# Patient Record
Sex: Female | Born: 1945
Health system: Southern US, Community
[De-identification: ages and names within clinical notes are randomized; demographics above are authoritative.]

## PROBLEM LIST (undated history)

## (undated) DIAGNOSIS — I10 Essential (primary) hypertension: Secondary | ICD-10-CM

## (undated) DIAGNOSIS — I2510 Atherosclerotic heart disease of native coronary artery without angina pectoris: Secondary | ICD-10-CM

## (undated) DIAGNOSIS — I429 Cardiomyopathy, unspecified: Secondary | ICD-10-CM

## (undated) DIAGNOSIS — K922 Gastrointestinal hemorrhage, unspecified: Secondary | ICD-10-CM

## (undated) DIAGNOSIS — E782 Mixed hyperlipidemia: Secondary | ICD-10-CM

## (undated) DIAGNOSIS — E119 Type 2 diabetes mellitus without complications: Secondary | ICD-10-CM

## (undated) DIAGNOSIS — N183 Chronic kidney disease, stage 3 unspecified: Secondary | ICD-10-CM

## (undated) HISTORY — DX: Chronic kidney disease, stage 3 unspecified: N18.30

## (undated) HISTORY — DX: Gastrointestinal hemorrhage, unspecified: K92.2

## (undated) HISTORY — PX: OTHER SURGICAL HISTORY: SHX169

## (undated) HISTORY — DX: Atherosclerotic heart disease of native coronary artery without angina pectoris: I25.10

## (undated) HISTORY — DX: Essential (primary) hypertension: I10

## (undated) HISTORY — DX: Type 2 diabetes mellitus without complications: E11.9

## (undated) HISTORY — DX: Cardiomyopathy, unspecified: I42.9

## (undated) HISTORY — DX: Mixed hyperlipidemia: E78.2

---

## 2020-08-03 ENCOUNTER — Other Ambulatory Visit: Payer: Self-pay

## 2020-08-03 ENCOUNTER — Encounter (HOSPITAL_COMMUNITY): Payer: Self-pay | Admitting: Emergency Medicine

## 2020-08-03 ENCOUNTER — Inpatient Hospital Stay (HOSPITAL_COMMUNITY)
Admission: EM | Admit: 2020-08-03 | Discharge: 2020-08-09 | DRG: 246 | Disposition: A | Discharge status: Home / Self Care | Payer: Medicare Other | Attending: Family Medicine | Admitting: Family Medicine

## 2020-08-03 ENCOUNTER — Emergency Department (HOSPITAL_COMMUNITY): Payer: Medicare Other

## 2020-08-03 DIAGNOSIS — I5041 Acute combined systolic (congestive) and diastolic (congestive) heart failure: Secondary | ICD-10-CM | POA: Diagnosis not present

## 2020-08-03 DIAGNOSIS — R42 Dizziness and giddiness: Secondary | ICD-10-CM | POA: Diagnosis not present

## 2020-08-03 DIAGNOSIS — N179 Acute kidney failure, unspecified: Secondary | ICD-10-CM | POA: Diagnosis not present

## 2020-08-03 DIAGNOSIS — E119 Type 2 diabetes mellitus without complications: Secondary | ICD-10-CM

## 2020-08-03 DIAGNOSIS — E1122 Type 2 diabetes mellitus with diabetic chronic kidney disease: Secondary | ICD-10-CM | POA: Diagnosis present

## 2020-08-03 DIAGNOSIS — I214 Non-ST elevation (NSTEMI) myocardial infarction: Secondary | ICD-10-CM | POA: Diagnosis present

## 2020-08-03 DIAGNOSIS — I161 Hypertensive emergency: Secondary | ICD-10-CM | POA: Diagnosis present

## 2020-08-03 DIAGNOSIS — E876 Hypokalemia: Secondary | ICD-10-CM | POA: Diagnosis present

## 2020-08-03 DIAGNOSIS — I5021 Acute systolic (congestive) heart failure: Secondary | ICD-10-CM | POA: Diagnosis present

## 2020-08-03 DIAGNOSIS — I509 Heart failure, unspecified: Secondary | ICD-10-CM

## 2020-08-03 DIAGNOSIS — I2510 Atherosclerotic heart disease of native coronary artery without angina pectoris: Secondary | ICD-10-CM | POA: Diagnosis not present

## 2020-08-03 DIAGNOSIS — Z833 Family history of diabetes mellitus: Secondary | ICD-10-CM

## 2020-08-03 DIAGNOSIS — I255 Ischemic cardiomyopathy: Secondary | ICD-10-CM | POA: Diagnosis present

## 2020-08-03 DIAGNOSIS — I130 Hypertensive heart and chronic kidney disease with heart failure and stage 1 through stage 4 chronic kidney disease, or unspecified chronic kidney disease: Secondary | ICD-10-CM | POA: Diagnosis present

## 2020-08-03 DIAGNOSIS — J9601 Acute respiratory failure with hypoxia: Secondary | ICD-10-CM | POA: Diagnosis present

## 2020-08-03 DIAGNOSIS — R112 Nausea with vomiting, unspecified: Secondary | ICD-10-CM | POA: Diagnosis not present

## 2020-08-03 DIAGNOSIS — Z8249 Family history of ischemic heart disease and other diseases of the circulatory system: Secondary | ICD-10-CM | POA: Diagnosis not present

## 2020-08-03 DIAGNOSIS — I25110 Atherosclerotic heart disease of native coronary artery with unstable angina pectoris: Secondary | ICD-10-CM | POA: Diagnosis present

## 2020-08-03 DIAGNOSIS — N182 Chronic kidney disease, stage 2 (mild): Secondary | ICD-10-CM | POA: Diagnosis present

## 2020-08-03 DIAGNOSIS — Z955 Presence of coronary angioplasty implant and graft: Secondary | ICD-10-CM

## 2020-08-03 DIAGNOSIS — I5020 Unspecified systolic (congestive) heart failure: Secondary | ICD-10-CM | POA: Diagnosis not present

## 2020-08-03 DIAGNOSIS — E1169 Type 2 diabetes mellitus with other specified complication: Secondary | ICD-10-CM | POA: Diagnosis not present

## 2020-08-03 DIAGNOSIS — R001 Bradycardia, unspecified: Secondary | ICD-10-CM | POA: Diagnosis not present

## 2020-08-03 DIAGNOSIS — E785 Hyperlipidemia, unspecified: Secondary | ICD-10-CM | POA: Diagnosis present

## 2020-08-03 DIAGNOSIS — I429 Cardiomyopathy, unspecified: Secondary | ICD-10-CM | POA: Diagnosis not present

## 2020-08-03 DIAGNOSIS — Z20822 Contact with and (suspected) exposure to covid-19: Secondary | ICD-10-CM | POA: Diagnosis present

## 2020-08-03 DIAGNOSIS — J810 Acute pulmonary edema: Secondary | ICD-10-CM | POA: Diagnosis not present

## 2020-08-03 HISTORY — DX: Essential (primary) hypertension: I10

## 2020-08-03 LAB — CBC WITH DIFFERENTIAL/PLATELET
Abs Immature Granulocytes: 0.04 10*3/uL (ref 0.00–0.07)
Basophils Absolute: 0.1 10*3/uL (ref 0.0–0.1)
Basophils Relative: 1 %
Eosinophils Absolute: 0.3 10*3/uL (ref 0.0–0.5)
Eosinophils Relative: 2 %
HCT: 48.1 % — ABNORMAL HIGH (ref 36.0–46.0)
Hemoglobin: 15.3 g/dL — ABNORMAL HIGH (ref 12.0–15.0)
Immature Granulocytes: 0 %
Lymphocytes Relative: 44 %
Lymphs Abs: 6.1 10*3/uL — ABNORMAL HIGH (ref 0.7–4.0)
MCH: 27.6 pg (ref 26.0–34.0)
MCHC: 31.8 g/dL (ref 30.0–36.0)
MCV: 86.7 fL (ref 80.0–100.0)
Monocytes Absolute: 0.7 10*3/uL (ref 0.1–1.0)
Monocytes Relative: 5 %
Neutro Abs: 6.8 10*3/uL (ref 1.7–7.7)
Neutrophils Relative %: 48 %
Platelets: 247 10*3/uL (ref 150–400)
RBC: 5.55 MIL/uL — ABNORMAL HIGH (ref 3.87–5.11)
RDW: 14 % (ref 11.5–15.5)
WBC: 13.9 10*3/uL — ABNORMAL HIGH (ref 4.0–10.5)
nRBC: 0 % (ref 0.0–0.2)

## 2020-08-03 LAB — COMPREHENSIVE METABOLIC PANEL
ALT: 16 U/L (ref 0–44)
AST: 28 U/L (ref 15–41)
Albumin: 4.3 g/dL (ref 3.5–5.0)
Alkaline Phosphatase: 86 U/L (ref 38–126)
Anion gap: 16 — ABNORMAL HIGH (ref 5–15)
BUN: 14 mg/dL (ref 8–23)
CO2: 20 mmol/L — ABNORMAL LOW (ref 22–32)
Calcium: 8.9 mg/dL (ref 8.9–10.3)
Chloride: 103 mmol/L (ref 98–111)
Creatinine, Ser: 1.16 mg/dL — ABNORMAL HIGH (ref 0.44–1.00)
GFR, Estimated: 50 mL/min — ABNORMAL LOW (ref >60)
Glucose, Bld: 249 mg/dL — ABNORMAL HIGH (ref 70–99)
Potassium: 2.9 mmol/L — ABNORMAL LOW (ref 3.5–5.1)
Sodium: 139 mmol/L (ref 135–145)
Total Bilirubin: 0.3 mg/dL (ref 0.3–1.2)
Total Protein: 8.4 g/dL — ABNORMAL HIGH (ref 6.5–8.1)

## 2020-08-03 LAB — TROPONIN I (HIGH SENSITIVITY)
Troponin I (High Sensitivity): 15 ng/L (ref <18)
Troponin I (High Sensitivity): 64 ng/L — ABNORMAL HIGH (ref <18)

## 2020-08-03 LAB — RESP PANEL BY RT-PCR (FLU A&B, COVID) ARPGX2
Influenza A by PCR: NEGATIVE
Influenza B by PCR: NEGATIVE
SARS Coronavirus 2 by RT PCR: NEGATIVE

## 2020-08-03 LAB — BRAIN NATRIURETIC PEPTIDE: B Natriuretic Peptide: 278 pg/mL — ABNORMAL HIGH (ref 0.0–100.0)

## 2020-08-03 MED ORDER — ONDANSETRON HCL 4 MG/2ML IJ SOLN
4.0000 mg | Freq: Once | INTRAMUSCULAR | Status: AC
  Administered 2020-08-03: 4 mg via INTRAVENOUS
  Filled 2020-08-03: qty 2

## 2020-08-03 MED ORDER — POTASSIUM CHLORIDE CRYS ER 20 MEQ PO TBCR
40.0000 meq | EXTENDED_RELEASE_TABLET | Freq: Once | ORAL | Status: AC
  Administered 2020-08-03: 40 meq via ORAL
  Filled 2020-08-03: qty 2

## 2020-08-03 MED ORDER — NITROGLYCERIN IN D5W 200-5 MCG/ML-% IV SOLN
5.0000 ug/min | INTRAVENOUS | Status: DC
  Administered 2020-08-03: 21:00:00 5 ug/min via INTRAVENOUS
  Filled 2020-08-03: qty 250

## 2020-08-03 MED ORDER — FUROSEMIDE 10 MG/ML IJ SOLN
40.0000 mg | Freq: Once | INTRAMUSCULAR | Status: AC
  Administered 2020-08-03: 40 mg via INTRAVENOUS
  Filled 2020-08-03: qty 4

## 2020-08-03 MED ORDER — POTASSIUM CHLORIDE 10 MEQ/100ML IV SOLN
10.0000 meq | Freq: Once | INTRAVENOUS | Status: AC
  Administered 2020-08-03: 10 meq via INTRAVENOUS
  Filled 2020-08-03: qty 100

## 2020-08-03 MED ORDER — HYDRALAZINE HCL 20 MG/ML IJ SOLN
10.0000 mg | Freq: Once | INTRAMUSCULAR | Status: AC
  Administered 2020-08-03: 10 mg via INTRAVENOUS
  Filled 2020-08-03: qty 1

## 2020-08-03 NOTE — ED Triage Notes (Addendum)
Pt was eating dinner and became SOB shortly afterwards. EMS called to house-upon arrival pt's O2 sats were 74% so they placed pt on NRB. Pt's work of breathing became better en-route and O2 was switched over to room air and pt was 90%. Pt currently with dry cough (new tonight), and per EMS: tachycardic and hypertensive. Here: pt diaphoretic and O2 sats 80%. Pt placed on O2 @ 4L and sats remain in 80"s. Pt then placed on 100% NRB and sats up to 98%. MD @ bedside.

## 2020-08-03 NOTE — ED Notes (Signed)
Date and time results received: 08/03/20 2341   Test: Troponin Critical Value: 64  Name of Provider Notified: Morton Peters, DO

## 2020-08-03 NOTE — ED Provider Notes (Signed)
[facility] EMERGENCY DEPARTMENT Provider Note   CSN: [id number] Arrival date & time: 08/03/20  2030     History Chief Complaint  Patient presents with  . Shortness of Breath    Cassandra Johnson is a 75 y.o. female.  Patient presents short of breath and hypoxic.  This occurred all of a sudden.  When paramedics arrived her O2 sat was in the 70s.  Patient has history of hypertension but has not taken medicine in 10 years  The history is provided by the patient and medical records. No language interpreter was used.  Shortness of Breath Severity:  Severe Onset quality:  Sudden Timing:  Constant Progression:  Worsening Chronicity:  New Context: activity   Relieved by:  Nothing Worsened by:  Nothing Ineffective treatments:  None tried Associated symptoms: no abdominal pain, no chest pain, no cough, no headaches and no rash        Past Medical History:  Diagnosis Date  . Hypertension     There are no problems to display for this patient.   History reviewed. No pertinent surgical history.   OB History   No obstetric history on file.     History reviewed. No pertinent family history.  Social History   Tobacco Use  . Smoking status: Never Smoker  . Smokeless tobacco: Never Used  Substance Use Topics  . Alcohol use: Not Currently  . Drug use: Not Currently    Home Medications Prior to Admission medications   Not on File    Allergies    Patient has no known allergies.  Review of Systems   Review of Systems  Constitutional: Negative for appetite change and fatigue.  HENT: Negative for congestion, ear discharge and sinus pressure.   Eyes: Negative for discharge.  Respiratory: Positive for shortness of breath. Negative for cough.   Cardiovascular: Negative for chest pain.  Gastrointestinal: Negative for abdominal pain and diarrhea.  Genitourinary: Negative for frequency and hematuria.  Musculoskeletal: Negative for back pain.  Skin: Negative for rash.   Neurological: Negative for seizures and headaches.  Psychiatric/Behavioral: Negative for hallucinations.    Physical Exam Updated Vital Signs BP 112/81   Pulse 77   Temp (!) 96.7 F (35.9 C) (Oral)   Resp 17   Ht 5\' 7"  (1.702 m)   Wt 90.7 kg   SpO2 98%   BMI 31.32 kg/m   Physical Exam Vitals and nursing note reviewed.  Constitutional:      General: She is in acute distress.     Appearance: She is well-developed.  HENT:     Head: Normocephalic.     Nose: Nose normal.  Eyes:     General: No scleral icterus.    Conjunctiva/sclera: Conjunctivae normal.  Neck:     Thyroid: No thyromegaly.  Cardiovascular:     Rate and Rhythm: Regular rhythm. Tachycardia present.     Heart sounds: No murmur heard. No friction rub. No gallop.   Pulmonary:     Breath sounds: No stridor. No wheezing or rales.     Comments: Tachypneic Chest:     Chest wall: No tenderness.  Abdominal:     General: There is no distension.     Tenderness: There is no abdominal tenderness. There is no rebound.  Musculoskeletal:        General: Normal range of motion.     Cervical back: Neck supple.  Lymphadenopathy:     Cervical: No cervical adenopathy.  Skin:    Findings: No erythema  or rash.  Neurological:     Mental Status: She is alert and oriented to person, place, and time.     Motor: No abnormal muscle tone.     Coordination: Coordination normal.  Psychiatric:        Behavior: Behavior normal.     ED Results / Procedures / Treatments   Labs (all labs ordered are listed, but only abnormal results are displayed) Labs Reviewed  CBC WITH DIFFERENTIAL/PLATELET - Abnormal; Notable for the following components:      Result Value   WBC 13.9 (*)    RBC 5.55 (*)    Hemoglobin 15.3 (*)    HCT 48.1 (*)    Lymphs Abs 6.1 (*)    All other components within normal limits  COMPREHENSIVE METABOLIC PANEL - Abnormal; Notable for the following components:   Potassium 2.9 (*)    CO2 20 (*)    Glucose,  Bld 249 (*)    Creatinine, Ser 1.16 (*)    Total Protein 8.4 (*)    GFR, Estimated 50 (*)    Anion gap 16 (*)    All other components within normal limits  BRAIN NATRIURETIC PEPTIDE - Abnormal; Notable for the following components:   B Natriuretic Peptide 278.0 (*)    All other components within normal limits  RESP PANEL BY RT-PCR (FLU A&B, COVID) ARPGX2  TROPONIN I (HIGH SENSITIVITY)  TROPONIN I (HIGH SENSITIVITY)    EKG None  Radiology DG Chest Port 1 View  Result Date: 08/03/2020 CLINICAL DATA:  Shortness of breath, respiratory distress EXAM: PORTABLE CHEST 1 VIEW COMPARISON:  None. FINDINGS: Diffuse hazy perihilar and basilar predominant opacities in both lungs with indistinct pulmonary vascularity. No pneumothorax or visible effusion. Enlarged cardiomediastinal silhouette. No acute osseous or soft tissue abnormality. Degenerative changes are present in the imaged spine and shoulders. Telemetry leads overlie the chest. IMPRESSION: Cardiomegaly with diffuse hazy perihilar and basilar predominant opacities, most likely edema/CHF. Infection not fully excluded. Electronically Signed   By: Kreg Shropshire M.D.   On: 08/03/2020 21:04    Procedures Procedures   Medications Ordered in ED Medications  nitroGLYCERIN 50 mg in dextrose 5 % 250 mL (0.2 mg/mL) infusion (20 mcg/min Intravenous Infusion Verify 08/03/20 2303)  potassium chloride SA (KLOR-CON) CR tablet 40 mEq (0 mEq Oral Hold 08/03/20 2212)  furosemide (LASIX) injection 40 mg (40 mg Intravenous Given 08/03/20 2049)  hydrALAZINE (APRESOLINE) injection 10 mg (10 mg Intravenous Given 08/03/20 2102)  ondansetron (ZOFRAN) injection 4 mg (4 mg Intravenous Given 08/03/20 2207)  potassium chloride 10 mEq in 100 mL IVPB (10 mEq Intravenous New Bag/Given 08/03/20 2207)    ED Course  I have reviewed the triage vital signs and the nursing notes.  Pertinent labs & imaging results that were available during my care of the patient were reviewed by me and  considered in my medical decision making (see chart for details). CRITICAL CARE Performed by: Bethann Berkshire Total critical care time: 35 minutes Critical care time was exclusive of separately billable procedures and treating other patients. Critical care was necessary to treat or prevent imminent or life-threatening deterioration. Critical care was time spent personally by me on the following activities: development of treatment plan with patient and/or surrogate as well as nursing, discussions with consultants, evaluation of patient's response to treatment, examination of patient, obtaining history from patient or surrogate, ordering and performing treatments and interventions, ordering and review of laboratory studies, ordering and review of radiographic studies, pulse oximetry and re-evaluation  of patient's condition.    MDM Rules/Calculators/A&P                            Pt with hypertensive emergency.   pt is in congestive heart failure.  She responded well to Lasix and Apresoline and nitro glycerin drip.  She is going to be admitted to medicine for hypertensive crisis  Final Clinical Impression(s) / ED Diagnoses Final diagnoses:  None    Rx / DC Orders ED Discharge Orders    None       Bethann Berkshire, MD 08/09/20 [id number]

## 2020-08-04 ENCOUNTER — Inpatient Hospital Stay (HOSPITAL_COMMUNITY): Payer: Medicare Other

## 2020-08-04 DIAGNOSIS — J9601 Acute respiratory failure with hypoxia: Secondary | ICD-10-CM | POA: Diagnosis present

## 2020-08-04 DIAGNOSIS — I509 Heart failure, unspecified: Secondary | ICD-10-CM

## 2020-08-04 DIAGNOSIS — I161 Hypertensive emergency: Secondary | ICD-10-CM | POA: Diagnosis not present

## 2020-08-04 DIAGNOSIS — E876 Hypokalemia: Secondary | ICD-10-CM | POA: Diagnosis present

## 2020-08-04 LAB — MRSA PCR SCREENING: MRSA by PCR: NEGATIVE

## 2020-08-04 LAB — COMPREHENSIVE METABOLIC PANEL
ALT: 16 U/L (ref 0–44)
AST: 27 U/L (ref 15–41)
Albumin: 3.8 g/dL (ref 3.5–5.0)
Alkaline Phosphatase: 73 U/L (ref 38–126)
Anion gap: 11 (ref 5–15)
BUN: 18 mg/dL (ref 8–23)
CO2: 24 mmol/L (ref 22–32)
Calcium: 9 mg/dL (ref 8.9–10.3)
Chloride: 105 mmol/L (ref 98–111)
Creatinine, Ser: 1.3 mg/dL — ABNORMAL HIGH (ref 0.44–1.00)
GFR, Estimated: 43 mL/min — ABNORMAL LOW (ref >60)
Glucose, Bld: 150 mg/dL — ABNORMAL HIGH (ref 70–99)
Potassium: 4.6 mmol/L (ref 3.5–5.1)
Sodium: 140 mmol/L (ref 135–145)
Total Bilirubin: 0.3 mg/dL (ref 0.3–1.2)
Total Protein: 7.5 g/dL (ref 6.5–8.1)

## 2020-08-04 LAB — ECHOCARDIOGRAM COMPLETE
Area-P 1/2: 3.72 cm2
Height: 67 in
S' Lateral: 2.66 cm
Weight: 3200 oz

## 2020-08-04 LAB — CBC
HCT: 40.2 % (ref 36.0–46.0)
Hemoglobin: 12.8 g/dL (ref 12.0–15.0)
MCH: 27.4 pg (ref 26.0–34.0)
MCHC: 31.8 g/dL (ref 30.0–36.0)
MCV: 86.1 fL (ref 80.0–100.0)
Platelets: 224 10*3/uL (ref 150–400)
RBC: 4.67 MIL/uL (ref 3.87–5.11)
RDW: 14.4 % (ref 11.5–15.5)
WBC: 11.8 10*3/uL — ABNORMAL HIGH (ref 4.0–10.5)
nRBC: 0 % (ref 0.0–0.2)

## 2020-08-04 LAB — LIPID PANEL
Cholesterol: 266 mg/dL — ABNORMAL HIGH (ref 0–200)
HDL: 70 mg/dL (ref >40)
LDL Cholesterol: 156 mg/dL — ABNORMAL HIGH (ref 0–99)
Total CHOL/HDL Ratio: 3.8 RATIO
Triglycerides: 198 mg/dL — ABNORMAL HIGH (ref <150)
VLDL: 40 mg/dL (ref 0–40)

## 2020-08-04 LAB — GLUCOSE, CAPILLARY
Glucose-Capillary: 239 mg/dL — ABNORMAL HIGH (ref 70–99)
Glucose-Capillary: 127 mg/dL — ABNORMAL HIGH (ref 70–99)
Glucose-Capillary: 117 mg/dL — ABNORMAL HIGH (ref 70–99)
Glucose-Capillary: 122 mg/dL — ABNORMAL HIGH (ref 70–99)
Glucose-Capillary: 105 mg/dL — ABNORMAL HIGH (ref 70–99)

## 2020-08-04 LAB — TROPONIN I (HIGH SENSITIVITY)
Troponin I (High Sensitivity): 346 ng/L (ref <18)
Troponin I (High Sensitivity): 304 ng/L (ref <18)
Troponin I (High Sensitivity): 245 ng/L (ref <18)

## 2020-08-04 LAB — HEPARIN LEVEL (UNFRACTIONATED): Heparin Unfractionated: 0.12 IU/mL — ABNORMAL LOW (ref 0.30–0.70)

## 2020-08-04 LAB — TSH: TSH: 3.396 u[IU]/mL (ref 0.350–4.500)

## 2020-08-04 LAB — HEMOGLOBIN A1C
Hgb A1c MFr Bld: 6.9 % — ABNORMAL HIGH (ref 4.8–5.6)
Mean Plasma Glucose: 151.33 mg/dL

## 2020-08-04 MED ORDER — ONDANSETRON HCL 4 MG/2ML IJ SOLN: 4.0000 mg | Freq: Four times a day (QID) | INTRAMUSCULAR | Status: DC | PRN

## 2020-08-04 MED ORDER — HEPARIN BOLUS VIA INFUSION
4000.0000 [IU] | Freq: Once | INTRAVENOUS | Status: AC
  Administered 2020-08-04: 4000 [IU] via INTRAVENOUS
  Filled 2020-08-04: qty 4000

## 2020-08-04 MED ORDER — ACETAMINOPHEN 325 MG PO TABS: 650.0000 mg | ORAL_TABLET | Freq: Four times a day (QID) | ORAL | Status: DC | PRN

## 2020-08-04 MED ORDER — CHLORHEXIDINE GLUCONATE CLOTH 2 % EX PADS
6.0000 | MEDICATED_PAD | Freq: Every day | CUTANEOUS | Status: DC
  Administered 2020-08-06: 6 via TOPICAL

## 2020-08-04 MED ORDER — POLYETHYLENE GLYCOL 3350 17 G PO PACK: 17.0000 g | PACK | Freq: Every day | ORAL | Status: DC | PRN

## 2020-08-04 MED ORDER — HEPARIN (PORCINE) 25000 UT/250ML-% IV SOLN
1350.0000 [IU]/h | INTRAVENOUS | Status: DC
  Administered 2020-08-04: 12:00:00 1000 [IU]/h via INTRAVENOUS
  Administered 2020-08-05 – 2020-08-06 (×3): 1300 [IU]/h via INTRAVENOUS
  Filled 2020-08-04 (×6): qty 250

## 2020-08-04 MED ORDER — ASPIRIN 325 MG PO TABS
325.0000 mg | ORAL_TABLET | Freq: Every day | ORAL | Status: DC
  Administered 2020-08-04 – 2020-08-06 (×3): 325 mg via ORAL
  Filled 2020-08-04 (×3): qty 1

## 2020-08-04 MED ORDER — FUROSEMIDE 10 MG/ML IJ SOLN
20.0000 mg | Freq: Every day | INTRAMUSCULAR | Status: DC
  Administered 2020-08-04: 20 mg via INTRAVENOUS
  Filled 2020-08-04: qty 2

## 2020-08-04 MED ORDER — ONDANSETRON HCL 4 MG PO TABS: 4.0000 mg | ORAL_TABLET | Freq: Four times a day (QID) | ORAL | Status: DC | PRN

## 2020-08-04 MED ORDER — ACETAMINOPHEN 650 MG RE SUPP: 650.0000 mg | Freq: Four times a day (QID) | RECTAL | Status: DC | PRN

## 2020-08-04 MED ORDER — HEPARIN BOLUS VIA INFUSION
2000.0000 [IU] | Freq: Once | INTRAVENOUS | Status: AC
  Administered 2020-08-04: 2000 [IU] via INTRAVENOUS
  Filled 2020-08-04: qty 2000

## 2020-08-04 MED ORDER — HEPARIN SODIUM (PORCINE) 5000 UNIT/ML IJ SOLN
5000.0000 [IU] | Freq: Three times a day (TID) | INTRAMUSCULAR | Status: DC
  Administered 2020-08-04: 5000 [IU] via SUBCUTANEOUS
  Filled 2020-08-04: qty 1

## 2020-08-04 MED ORDER — ATORVASTATIN CALCIUM 40 MG PO TABS
40.0000 mg | ORAL_TABLET | Freq: Every day | ORAL | Status: DC
  Administered 2020-08-04 – 2020-08-08 (×5): 40 mg via ORAL
  Filled 2020-08-04 (×6): qty 1

## 2020-08-04 MED ORDER — OXYCODONE HCL 5 MG PO TABS: 5.0000 mg | ORAL_TABLET | ORAL | Status: DC | PRN

## 2020-08-04 MED ORDER — LOSARTAN POTASSIUM 25 MG PO TABS
25.0000 mg | ORAL_TABLET | Freq: Every day | ORAL | Status: DC
  Administered 2020-08-04 – 2020-08-05 (×2): 25 mg via ORAL
  Filled 2020-08-04 (×2): qty 1

## 2020-08-04 MED ORDER — INSULIN ASPART 100 UNIT/ML ~~LOC~~ SOLN
0.0000 [IU] | Freq: Three times a day (TID) | SUBCUTANEOUS | Status: DC
  Administered 2020-08-04 – 2020-08-07 (×6): 2 [IU] via SUBCUTANEOUS
  Administered 2020-08-08: 3 [IU] via SUBCUTANEOUS

## 2020-08-04 MED ORDER — CARVEDILOL 3.125 MG PO TABS
6.2500 mg | ORAL_TABLET | Freq: Two times a day (BID) | ORAL | Status: DC
  Administered 2020-08-04 – 2020-08-06 (×5): 6.25 mg via ORAL
  Filled 2020-08-04 (×5): qty 2

## 2020-08-04 MED ORDER — INSULIN ASPART 100 UNIT/ML ~~LOC~~ SOLN
0.0000 [IU] | Freq: Every day | SUBCUTANEOUS | Status: DC
  Administered 2020-08-04: 02:00:00 2 [IU] via SUBCUTANEOUS

## 2020-08-04 NOTE — Progress Notes (Signed)
Critical lab troponin called of 346, Paged Dr. Kerry Hough with result.

## 2020-08-04 NOTE — Progress Notes (Signed)
ANTICOAGULATION CONSULT NOTE - Initial Consult  Pharmacy Consult for Heparin Indication: chest pain/ACS  No Known Allergies  Patient Measurements: Height: 5\' 7"  (170.2 cm) Weight: 90.7 kg (200 lb) IBW/kg (Calculated) : 61.6 HEPARIN DW (KG): 81.1  Vital Signs: Temp: 99.2 F (37.3 C) (03/05 0900) Temp Source: Oral (03/05 0900) BP: 140/88 (03/05 1000) Pulse Rate: 86 (03/05 0100)  Labs: Recent Labs    08/03/20 2101 08/03/20 2251 08/04/20 0625  HGB 15.3*  --   --   HCT 48.1*  --   --   PLT 247  --   --   CREATININE 1.16*  --  1.30*  TROPONINIHS 15 64* 346*    Estimated Creatinine Clearance: 44.5 mL/min (A) (by C-G formula based on SCr of 1.3 mg/dL (H)).   Medical History: Past Medical History:  Diagnosis Date  . Hypertension     Medications:  No medications prior to admission.    Assessment: Patient admitted with sudden onset shortness of breath. It was non exertional. She also had chest pressure in the middle of her chest but it did not radiate. Troponins are elevated and MD asked to start heparin. Reviewed medications and not on any oral anticoagulants.  Goal of Therapy:  Heparin level 0.3-0.7 units/ml Monitor platelets by anticoagulation protocol: Yes   Plan:  Give 4000 units bolus x 1 Start heparin infusion at 1000 units/hr Check anti-Xa level in ~8  hours and daily while on heparin Continue to monitor H&H and platelets  10/04/20, BS Elder Cyphers, BCPS Clinical Pharmacist Pager [phone] 08/04/2020,11:36 AM

## 2020-08-04 NOTE — H&P (Signed)
TRH H&P    Patient Demographics:    Cassandra Johnson, is a 75 y.o. female  MRN: [id number]  DOB - 07/24/1945  Admit Date - 08/03/2020  Referring MD/NP/PA: Estell HarpinZammit  Outpatient Primary MD for the patient is Patient, No Pcp Per  Patient coming from: Home  Chief complaint- Dyspnea   HPI:    Cassandra MullGracie Piekarski  is a 75 y.o. female,with history of HTN, with history of hypertensive emergency and admission for the same in the past. Patient reports that after dinner tonight, she had sudden onset shortness of breath. It was non exertional. Nothing made it worse, and going outside made it better. She had associated wheeze and chest pressure in the middle of her chest. The pressure did not radiate to back, neck, or shoulders. The sensation lasted until the ambulance was en route to the hospital. She had two episodes of emesis in the ER. It was non bloody and appeared as undigested food. She also had two loose stools in the ED. Patient reports that the meal just prior to this starting was meatloaf from a box, green beans, and canned apples. She admits that this is a high sodium meal. Patient reports that she has never had these symptoms before. She does endorse a previous hospitalization for the same in 2009. She reports that she did not take medications after that because Jesus has always protected her.   Patient does not smoke, does not drink, and does not use illicit drugs. She is not vaccinated for covid for religious reasons. Patient is full code.   In the ED T 96.7, HR 74 - 120 BP 121/89 at admission sats 88% but improved with Nasal cannula WBC 13.9 - stress reaction Hgb 15.3 Chem panel = K+ 2.9, BUN/Cr 14/1.16 - no comparison, glucose 249 BNP 278, trop 15, and then 64 Neg covid CXR = cardiomegaly and vascular congestion Patient was given Lasix, hydralazine, nitro drip, zofran, and 50mEq K+    Review of systems:    In  addition to the HPI above,  No Fever-chills, No Headache, No changes with Vision or hearing, No problems swallowing food or Liquids, Endroses chest pressure, and dyspnea, no cough  No Abdominal pain, admits to nausea and two loose bowel movements, No Blood in stool or Urine, No dysuria, No new skin rashes or bruises, No new joints pains-aches,  No new weakness, tingling, numbness in any extremity, No recent weight gain or loss, No polyuria, polydypsia or polyphagia, No significant Mental Stressors.  All other systems reviewed and are negative.    Past History of the following :    Past Medical History:  Diagnosis Date  . Hypertension       History reviewed. No pertinent surgical history.    Social History:      Social History   Tobacco Use  . Smoking status: Never Smoker  . Smokeless tobacco: Never Used  Substance Use Topics  . Alcohol use: Not Currently       Family History :    History reviewed. No pertinent family  history. Family hx of HTN, and DMII   Home Medications:   Prior to Admission medications   Not on File     Allergies:    No Known Allergies   Physical Exam:   Vitals  Blood pressure (!) 142/93, pulse 86, temperature (!) 96.7 F (35.9 C), temperature source Oral, resp. rate 14, height 5\' 7"  (1.702 m), weight 90.7 kg, SpO2 98 %.  1.  General: Laying supine in bed, no acute distress  2. Psychiatric: A&O x 3, normal affect, cooperative with exam  3. Neurologic: CN II-XII intact, moves all four extremities voluntarily, speech and language normal, no acute deficit on limited exam  4. HEENMT:  Head is AT, [place], pupils reactive, neck supple, mucous membranes moist, trachea midline  5. Respiratory : LCTABL, no wheezes, rhonchi, fine crackles  6. Cardiovascular : HR normal, rhythm regular, no murmurs, rubs, or gallops, no peripheral edema   7. Gastrointestinal:  Abdomen is soft, non distended, non tender to palpation, bowel sounds  normal  8. Skin:  Skin is warm, dry, and intact without acute lesion on limited exam  9.Musculoskeletal:  No acute deformities, no peripheral edema, no calf tenderness    Data Review:    CBC Recent Labs  Lab 08/03/20 2101  WBC 13.9*  HGB 15.3*  HCT 48.1*  PLT 247  MCV 86.7  MCH 27.6  MCHC 31.8  RDW 14.0  LYMPHSABS 6.1*  MONOABS 0.7  EOSABS 0.3  BASOSABS 0.1   ------------------------------------------------------------------------------------------------------------------  Results for orders placed or performed during the hospital encounter of 08/03/20 (from the past 48 hour(s))  Brain natriuretic peptide     Status: Abnormal   Collection Time: 08/03/20  8:52 PM  Result Value Ref Range   B Natriuretic Peptide 278.0 (H) 0.0 - 100.0 pg/mL    Comment: Performed at [facility], [address], [place], [place]  CBC with Differential/Platelet     Status: Abnormal   Collection Time: 08/03/20  9:01 PM  Result Value Ref Range   WBC 13.9 (H) 4.0 - 10.5 K/uL   RBC 5.55 (H) 3.87 - 5.11 MIL/uL   Hemoglobin 15.3 (H) 12.0 - 15.0 g/dL   HCT 10/03/20 (H) 03.4 - 74.2 %   MCV 86.7 80.0 - 100.0 fL   MCH 27.6 26.0 - 34.0 pg   MCHC 31.8 30.0 - 36.0 g/dL   RDW 59.5 63.8 - 75.6 %   Platelets 247 150 - 400 K/uL   nRBC 0.0 0.0 - 0.2 %   Neutrophils Relative % 48 %   Neutro Abs 6.8 1.7 - 7.7 K/uL   Lymphocytes Relative 44 %   Lymphs Abs 6.1 (H) 0.7 - 4.0 K/uL   Monocytes Relative 5 %   Monocytes Absolute 0.7 0.1 - 1.0 K/uL   Eosinophils Relative 2 %   Eosinophils Absolute 0.3 0.0 - 0.5 K/uL   Basophils Relative 1 %   Basophils Absolute 0.1 0.0 - 0.1 K/uL   Immature Granulocytes 0 %   Abs Immature Granulocytes 0.04 0.00 - 0.07 K/uL    Comment: Performed at [facility], [address] [place], [place]  Comprehensive metabolic panel     Status: Abnormal   Collection Time: 08/03/20  9:01 PM  Result Value Ref Range   Sodium 139 135 - 145 mmol/L   Potassium 2.9 (L)  3.5 - 5.1 mmol/L   Chloride 103 98 - 111 mmol/L   CO2 20 (L) 22 - 32 mmol/L   Glucose, Bld  249 (H) 70 - 99 mg/dL    Comment: Glucose reference range applies only to samples taken after fasting for at least 8 hours.   BUN 14 8 - 23 mg/dL   Creatinine, Ser 3.14 (H) 0.44 - 1.00 mg/dL   Calcium 8.9 8.9 - 97.0 mg/dL   Total Protein 8.4 (H) 6.5 - 8.1 g/dL   Albumin 4.3 3.5 - 5.0 g/dL   AST 28 15 - 41 U/L   ALT 16 0 - 44 U/L   Alkaline Phosphatase 86 38 - 126 U/L   Total Bilirubin 0.3 0.3 - 1.2 mg/dL   GFR, Estimated 50 (L) >60 mL/min    Comment: (NOTE) Calculated using the CKD-EPI Creatinine Equation (2021)    Anion gap 16 (H) 5 - 15    Comment: Performed at [facility], [address] [place], [place] [address]  Troponin I (High Sensitivity)     Status: None   Collection Time: 08/03/20  9:01 PM  Result Value Ref Range   Troponin I (High Sensitivity) 15 <18 ng/L    Comment: (NOTE) Elevated high sensitivity troponin I (hsTnI) values and significant  changes across serial measurements may suggest ACS but many other  chronic and acute conditions are known to elevate hsTnI results.  Refer to the "Links" section for chest pain algorithms and additional  guidance. Performed at [facility], [address], [place], [place] [address]   Resp Panel by RT-PCR (Flu A&B, Covid) Nasopharyngeal Swab     Status: None   Collection Time: 08/03/20  9:02 PM   Specimen: Nasopharyngeal Swab; Nasopharyngeal(NP) swabs in vial transport medium  Result Value Ref Range   SARS Coronavirus 2 by RT PCR NEGATIVE NEGATIVE    Comment: (NOTE) SARS-CoV-2 target nucleic acids are NOT DETECTED.  The SARS-CoV-2 RNA is generally detectable in upper respiratory specimens during the acute phase of infection. The lowest concentration of SARS-CoV-2 viral copies this assay can detect is 138 copies/mL. A negative result does not preclude SARS-Cov-2 infection and should not be used as the sole basis for treatment or other  patient management decisions. A negative result may occur with  improper specimen collection/handling, submission of specimen other than nasopharyngeal swab, presence of viral mutation(s) within the areas targeted by this assay, and inadequate number of viral copies(<138 copies/mL). A negative result must be combined with clinical observations, patient history, and epidemiological information. The expected result is Negative.  Fact Sheet for Patients:  BloggerCourse.com  Fact Sheet for Healthcare Providers:  SeriousBroker.it  This test is no t yet approved or cleared by the [place] FDA and  has been authorized for detection and/or diagnosis of SARS-CoV-2 by FDA under an Emergency Use Authorization (EUA). This EUA will remain  in effect (meaning this test can be used) for the duration of the COVID-19 declaration under Section 564(b)(1) of the Act, 21 U.S.C.section 360bbb-3(b)(1), unless the authorization is terminated  or revoked sooner.       Influenza A by PCR NEGATIVE NEGATIVE   Influenza B by PCR NEGATIVE NEGATIVE    Comment: (NOTE) The Xpert Xpress SARS-CoV-2/FLU/RSV plus assay is intended as an aid in the diagnosis of influenza from Nasopharyngeal swab specimens and should not be used as a sole basis for treatment. Nasal washings and aspirates are unacceptable for Xpert Xpress SARS-CoV-2/FLU/RSV testing.  Fact Sheet for Patients: BloggerCourse.com  Fact Sheet for Healthcare Providers: SeriousBroker.it  This test is not yet approved or cleared by the [place] and has been authorized  for detection and/or diagnosis of SARS-CoV-2 by FDA under an Emergency Use Authorization (EUA). This EUA will remain in effect (meaning this test can be used) for the duration of the COVID-19 declaration under Section 564(b)(1) of the Act, 21 U.S.C. section 360bbb-3(b)(1), unless  the authorization is terminated or revoked.  Performed at [facility], [address] [place], [place] [address]   Troponin I (High Sensitivity)     Status: Abnormal   Collection Time: 08/03/20 10:51 PM  Result Value Ref Range   Troponin I (High Sensitivity) 64 (H) <18 ng/L    Comment: DELTA CHECK NOTED KENDRICK,D STATED THAT SHE WAS NOT SURE BUT SHE WILL RELAY THE INFORMATION TO THE DR. (NOTE) Elevated high sensitivity troponin I (hsTnI) values and significant  changes across serial measurements may suggest ACS but many other  chronic and acute conditions are known to elevate hsTnI results.  Refer to the Links section for chest pain algorithms and additional  guidance. Performed at [facility], [address] [place], [place] [address]     Chemistries  Recent Labs  Lab 08/03/20 2101  NA 139  K 2.9*  CL 103  CO2 20*  GLUCOSE 249*  BUN 14  CREATININE 1.16*  CALCIUM 8.9  AST 28  ALT 16  ALKPHOS 86  BILITOT 0.3   ------------------------------------------------------------------------------------------------------------------  ------------------------------------------------------------------------------------------------------------------ GFR: Estimated Creatinine Clearance: 49.9 mL/min (A) (by C-G formula based on SCr of 1.16 mg/dL (H)). Liver Function Tests: Recent Labs  Lab 08/03/20 2101  AST 28  ALT 16  ALKPHOS 86  BILITOT 0.3  PROT 8.4*  ALBUMIN 4.3   No results for input(s): LIPASE, AMYLASE in the last 168 hours. No results for input(s): AMMONIA in the last 168 hours. Coagulation Profile: No results for input(s): INR, PROTIME in the last 168 hours. Cardiac Enzymes: No results for input(s): CKTOTAL, CKMB, CKMBINDEX, TROPONINI in the last 168 hours. BNP (last 3 results) No results for input(s): PROBNP in the last 8760 hours. HbA1C: No results for input(s): HGBA1C in the last 72 hours. CBG: No results for input(s): GLUCAP in the last 168  hours. Lipid Profile: No results for input(s): CHOL, HDL, LDLCALC, TRIG, CHOLHDL, LDLDIRECT in the last 72 hours. Thyroid Function Tests: No results for input(s): TSH, T4TOTAL, FREET4, T3FREE, THYROIDAB in the last 72 hours. Anemia Panel: No results for input(s): VITAMINB12, FOLATE, FERRITIN, TIBC, IRON, RETICCTPCT in the last 72 hours.  --------------------------------------------------------------------------------------------------------------- Urine analysis: No results found for: COLORURINE, APPEARANCEUR, LABSPEC, PHURINE, GLUCOSEU, HGBUR, BILIRUBINUR, KETONESUR, PROTEINUR, UROBILINOGEN, NITRITE, LEUKOCYTESUR    Imaging Results:    DG Chest Port 1 View  Result Date: 08/03/2020 CLINICAL DATA:  Shortness of breath, respiratory distress EXAM: PORTABLE CHEST 1 VIEW COMPARISON:  None. FINDINGS: Diffuse hazy perihilar and basilar predominant opacities in both lungs with indistinct pulmonary vascularity. No pneumothorax or visible effusion. Enlarged cardiomediastinal silhouette. No acute osseous or soft tissue abnormality. Degenerative changes are present in the imaged spine and shoulders. Telemetry leads overlie the chest. IMPRESSION: Cardiomegaly with diffuse hazy perihilar and basilar predominant opacities, most likely edema/CHF. Infection not fully excluded. Electronically Signed   By: Kreg Shropshire M.D.   On: 08/03/2020 21:04    My personal review of EKG: Rhythm ST, Rate 109/min, QTc474 ,no Acute ST changes   Assessment & Plan:    Principal Problem:   CHF (congestive heart failure) (HCC) Active Problems:   Hypertensive emergency   Hypokalemia   Acute respiratory failure with hypoxia (HCC)   1. Acute hypoxic Respiratory failure  1. Covid negative 2. CXR with signs of CHF 3. EKG without ischemic changes 4. Currently on 3L 5. Wean off of O2 as tolerated 6. Anticipate improvement with diuresis 2. Hypertensive emergency with acute congestive heart failure 1. With pulm edema and  cardiomegaly on CXR, elevated troponin-64, and respiratory failure 2. BNP 278 3. Hydralazine and nitro drip in ED 4. Start ARB and Beta Blocker in the ED 5. Echo in the AM 6. Continue diuresis 7. Fluid restrictions 8. Low sodium diet 9. Strict Intake and [place]  3. Hypokalemia 1. K+ 3.9 2. IV and PO in ED 3. Trend in AM 4. DMII 1. Glucose 249 2. Patient advised on diet 3. Hgb A1C in the AM 4. Cover with sliding scale 5.    DVT Prophylaxis-   heparin - SCDs   AM Labs Ordered, also please review Full Orders  Family Communication: Admission, patients condition and plan of care including tests being ordered have been discussed with the patient and daughter who indicate understanding and agree with the plan and Code Status.  Code Status:  FULL  Admission status:Inpatient :The appropriate admission status for this patient is INPATIENT. Inpatient status is judged to be reasonable and necessary in order to provide the required intensity of service to ensure the patient's safety. The patient's presenting symptoms, physical exam findings, and initial radiographic and laboratory data in the context of their chronic comorbidities is felt to place them at high risk for further clinical deterioration. Furthermore, it is not anticipated that the patient will be medically stable for discharge from the hospital within 2 midnights of admission. The following factors support the admission status of inpatient.     The patient's presenting symptoms include dyspnea The worrisome physical exam findings include BP 243/157 The initial radiographic and laboratory data are worrisome because of pulm edema on chest xray The chronic co-morbidities include HTN       * I certify that at the point of admission it is my clinical judgment that the patient will require inpatient hospital care spanning beyond 2 midnights from the point of admission due to high intensity of service, high risk for  further deterioration and high frequency of surveillance required.*  Time spent in minutes : FULL   Asia B Zierle-Ghosh DO

## 2020-08-04 NOTE — Progress Notes (Addendum)
ANTICOAGULATION CONSULT NOTE -   Pharmacy Consult for Heparin Indication: chest pain/ACS  No Known Allergies  Patient Measurements: Height: 5\' 7"  (170.2 cm) Weight: 90.7 kg (200 lb) IBW/kg (Calculated) : 61.6 HEPARIN DW (KG): 81.1  Vital Signs: Temp: 98.9 F (37.2 C) (03/05 1930) Temp Source: Oral (03/05 1930) BP: 145/79 (03/05 1900) Pulse Rate: 72 (03/05 1400)  Labs: Recent Labs    08/03/20 2101 08/03/20 2251 08/04/20 0625 08/04/20 1119 08/04/20 1331 08/04/20 1940  HGB 15.3*  --   --  12.8  --   --   HCT 48.1*  --   --  40.2  --   --   PLT 247  --   --  224  --   --   HEPARINUNFRC  --   --   --   --   --  0.12*  CREATININE 1.16*  --  1.30*  --   --   --   TROPONINIHS 15   < > 346* 304* 245*  --    < > = values in this interval not displayed.    Estimated Creatinine Clearance: 44.5 mL/min (A) (by C-G formula based on SCr of 1.3 mg/dL (H)).   Medical History: Past Medical History:  Diagnosis Date  . Hypertension     Medications:  No medications prior to admission.    Assessment: Patient admitted with sudden onset shortness of breath. It was non exertional. She also had chest pressure in the middle of her chest but it did not radiate. Troponins are elevated and MD asked to start heparin. Reviewed medications and not on any oral anticoagulants.  Heparin level is 0.12, subtherapeutic. No issues with infusion  Goal of Therapy:  Heparin level 0.3-0.7 units/ml Monitor platelets by anticoagulation protocol: Yes   Plan:  Give 2000 units bolus x 1 Increase heparin infusion at 1300 units/hr Check anti-Xa level in ~8  hours and daily while on heparin Continue to monitor H&H and platelets  10/04/20, BS Elder Cyphers, BCPS Clinical Pharmacist Pager [phone] 08/04/2020,8:20 PM

## 2020-08-04 NOTE — Progress Notes (Signed)
*  PRELIMINARY RESULTS* Echocardiogram 2D Echocardiogram has been performed.  Cassandra Johnson 08/04/2020, 9:31 AM

## 2020-08-04 NOTE — Progress Notes (Signed)
Patient admitted to the hospital earlier this morning by Dr. Carren Rang  Patient seen and examined.  Denies any chest pain or shortness of breath at this time.  Currently on nitroglycerin infusion.  Lungs are clear to auscultation bilaterally.  She does not have any lower extremity edema.  Assessment/plan  Acute respiratory failure with hypoxia Secondary to pulmonary edema Initial chest x-ray indicated signs of CHF Wean off oxygen as tolerated  Hypertensive emergency with acute systolic congestive heart failure Started on nitroglycerin infusion, hydralazine, ARB and beta-blockers Overall blood pressures are improving with diuresis  NSTEMI Troponin has peaked at 346 EKG does show T wave inversions in anterolateral leads Reviewed with Dr. Royann Shivers on-call for cardiology Recommendations were to continue medical management at this point with aspirin, ARB, beta-blocker We will start the patient on heparin x48 hours She will need to be seen by cardiology on 3/7 to determine if she needs stress test versus cardiac cath Echocardiogram showed ejection fraction of 40 to 45% with global hypokinesis Start on statin  Hyperlipidemia LDL 156 Start on Lipitor  Type 2 diabetes Currently on sliding scale insulin A1c 6.9  Hypokalemia Replace  [employer]

## 2020-08-05 DIAGNOSIS — N179 Acute kidney failure, unspecified: Secondary | ICD-10-CM | POA: Diagnosis not present

## 2020-08-05 DIAGNOSIS — I5021 Acute systolic (congestive) heart failure: Secondary | ICD-10-CM

## 2020-08-05 DIAGNOSIS — E785 Hyperlipidemia, unspecified: Secondary | ICD-10-CM | POA: Diagnosis present

## 2020-08-05 DIAGNOSIS — E119 Type 2 diabetes mellitus without complications: Secondary | ICD-10-CM

## 2020-08-05 DIAGNOSIS — I214 Non-ST elevation (NSTEMI) myocardial infarction: Secondary | ICD-10-CM | POA: Diagnosis present

## 2020-08-05 LAB — BASIC METABOLIC PANEL
Anion gap: 11 (ref 5–15)
BUN: 24 mg/dL — ABNORMAL HIGH (ref 8–23)
CO2: 24 mmol/L (ref 22–32)
Calcium: 8.8 mg/dL — ABNORMAL LOW (ref 8.9–10.3)
Chloride: 105 mmol/L (ref 98–111)
Creatinine, Ser: 1.85 mg/dL — ABNORMAL HIGH (ref 0.44–1.00)
GFR, Estimated: 28 mL/min — ABNORMAL LOW (ref >60)
Glucose, Bld: 138 mg/dL — ABNORMAL HIGH (ref 70–99)
Potassium: 4.3 mmol/L (ref 3.5–5.1)
Sodium: 140 mmol/L (ref 135–145)

## 2020-08-05 LAB — CBC
HCT: 35.1 % — ABNORMAL LOW (ref 36.0–46.0)
Hemoglobin: 11.6 g/dL — ABNORMAL LOW (ref 12.0–15.0)
MCH: 27.8 pg (ref 26.0–34.0)
MCHC: 33 g/dL (ref 30.0–36.0)
MCV: 84.2 fL (ref 80.0–100.0)
Platelets: 191 10*3/uL (ref 150–400)
RBC: 4.17 MIL/uL (ref 3.87–5.11)
RDW: 14.4 % (ref 11.5–15.5)
WBC: 11 10*3/uL — ABNORMAL HIGH (ref 4.0–10.5)
nRBC: 0 % (ref 0.0–0.2)

## 2020-08-05 LAB — GLUCOSE, CAPILLARY
Glucose-Capillary: 118 mg/dL — ABNORMAL HIGH (ref 70–99)
Glucose-Capillary: 123 mg/dL — ABNORMAL HIGH (ref 70–99)
Glucose-Capillary: 108 mg/dL — ABNORMAL HIGH (ref 70–99)
Glucose-Capillary: 113 mg/dL — ABNORMAL HIGH (ref 70–99)

## 2020-08-05 LAB — HEPARIN LEVEL (UNFRACTIONATED)
Heparin Unfractionated: 0.62 IU/mL (ref 0.30–0.70)
Heparin Unfractionated: 0.6 IU/mL (ref 0.30–0.70)

## 2020-08-05 LAB — MAGNESIUM: Magnesium: 1.8 mg/dL (ref 1.7–2.4)

## 2020-08-05 MED ORDER — LACTATED RINGERS IV SOLN: INTRAVENOUS | Status: AC

## 2020-08-05 NOTE — Progress Notes (Signed)
ANTICOAGULATION CONSULT NOTE -   Pharmacy Consult for Heparin Indication: chest pain/ACS  No Known Allergies  Patient Measurements: Height: 5\' 7"  (170.2 cm) Weight: 77.7 kg (171 lb 4.8 oz) IBW/kg (Calculated) : 61.6 HEPARIN DW (KG): 81.1  Vital Signs: Temp: 99 F (37.2 C) (03/06 0738) Temp Source: Oral (03/06 0738) BP: 149/91 (03/06 0645)  Labs: Recent Labs    08/03/20 2101 08/03/20 2251 08/04/20 0625 08/04/20 1119 08/04/20 1331 08/04/20 1940 08/05/20 0452  HGB 15.3*  --   --  12.8  --   --  11.6*  HCT 48.1*  --   --  40.2  --   --  35.1*  PLT 247  --   --  224  --   --  191  HEPARINUNFRC  --   --   --   --   --  0.12* 0.62  CREATININE 1.16*  --  1.30*  --   --   --  1.85*  TROPONINIHS 15   < > 346* 304* 245*  --   --    < > = values in this interval not displayed.    Estimated Creatinine Clearance: 29.1 mL/min (A) (by C-G formula based on SCr of 1.85 mg/dL (H)).   Medical History: Past Medical History:  Diagnosis Date  . Hypertension     Medications:  No medications prior to admission.    Assessment: Patient admitted with sudden onset shortness of breath. It was non exertional. She also had chest pressure in the middle of her chest but it did not radiate. Troponins are elevated and MD asked to start heparin. Reviewed medications and not on any oral anticoagulants.  Heparin level is 0.62, therapeutic. No issues with infusion hgb 15.3> 11.6. No signs of bleeding, MD notified  Goal of Therapy:  Heparin level 0.3-0.7 units/ml Monitor platelets by anticoagulation protocol: Yes   Plan:  Continue with heparin infusion at 1300 units/hr Check anti-Xa level in ~8  hours and daily while on heparin Continue to monitor H&H and platelets  10/05/20, BS Elder Cyphers, BCPS Clinical Pharmacist Pager [phone] 08/05/2020,8:04 AM

## 2020-08-05 NOTE — Progress Notes (Signed)
ANTICOAGULATION CONSULT NOTE -   Pharmacy Consult for Heparin Indication: chest pain/ACS  No Known Allergies  Patient Measurements: Height: 5\' 7"  (170.2 cm) Weight: 77.7 kg (171 lb 4.8 oz) IBW/kg (Calculated) : 61.6 HEPARIN DW (KG): 81.1  Vital Signs: Temp: 98.5 F (36.9 C) (03/06 1203) Temp Source: Oral (03/06 1203) BP: 173/76 (03/06 1300) Pulse Rate: 66 (03/06 1300)  Labs: Recent Labs    08/03/20 2101 08/03/20 2251 08/04/20 0625 08/04/20 1119 08/04/20 1331 08/04/20 1940 08/05/20 0452 08/05/20 1250  HGB 15.3*  --   --  12.8  --   --  11.6*  --   HCT 48.1*  --   --  40.2  --   --  35.1*  --   PLT 247  --   --  224  --   --  191  --   HEPARINUNFRC  --   --   --   --   --  0.12* 0.62 0.60  CREATININE 1.16*  --  1.30*  --   --   --  1.85*  --   TROPONINIHS 15   < > 346* 304* 245*  --   --   --    < > = values in this interval not displayed.    Estimated Creatinine Clearance: 29.1 mL/min (A) (by C-G formula based on SCr of 1.85 mg/dL (H)).   Medical History: Past Medical History:  Diagnosis Date  . Hypertension     Medications:  No medications prior to admission.    Assessment: Patient admitted with sudden onset shortness of breath. It was non exertional. She also had chest pressure in the middle of her chest but it did not radiate. Troponins are elevated and MD asked to start heparin. Reviewed medications and not on any oral anticoagulants.  Heparin level is 0.6, therapeutic. No issues with infusion hgb 15.3> 11.6. No signs of bleeding, MD notified  Goal of Therapy:  Heparin level 0.3-0.7 units/ml Monitor platelets by anticoagulation protocol: Yes   Plan:  Continue with heparin infusion at 1300 units/hr Check anti-Xa level  daily while on heparin Continue to monitor H&H and platelets  10/05/20, BS Elder Cyphers, BCPS Clinical Pharmacist Pager [phone] 08/05/2020,1:56 PM

## 2020-08-05 NOTE — TOC Initial Note (Signed)
Transition of Care [facility]) - Initial/Assessment Note    Patient Details  Name: Cassandra [place] MRN: [id number] Date of Birth: 1946/03/19  Transition of Care [facility]) CM/SW Contact:    Barry Brunner, LCSW Phone Number: 08/05/2020, 10:46 AM  Clinical Narrative:                 Patient is a 75 year old female admitted with CHF. CSW received a CHF consult for the patient. Patient's daughter reported that CHF is a new diagnosis for the patient. CSW informed patient's daughter of the recommendations for CHF such as monitoring fluid intake, maintaining a heart healthy diet, and monitoring weight fluctuations daily. Patient's daughter reported that patient is agreeable to begin working on a heart healthy diet an following other recommendations. Patient's family reported that they would be agreeeable to [facility] if recommended. TOC to follow.  Expected Discharge Plan: Home w Home Health Services Barriers to Discharge: Continued Medical Work up   Patient Goals and CMS Choice Patient states their goals for this hospitalization and ongoing recovery are:: Return home CMS Medicare.gov Compare Post Acute Care list provided to:: Patient Choice offered to / list presented to : Patient,Adult Children  Expected Discharge Plan and Services Expected Discharge Plan: Home w Home Health Services In-house Referral: NA Discharge Planning Services: NA   Living arrangements for the past 2 months: Single Family Home                 DME Arranged: N/A DME Agency: NA       HH Arranged: NA HH Agency: NA        Prior Living Arrangements/Services Living arrangements for the past 2 months: Single Family Home Lives with:: Self,Adult Children Patient language and need for interpreter reviewed:: Yes Do you feel safe going back to the place where you live?: Yes      Need for Family Participation in Patient Care: Yes (Comment) Care giver support system in place?: Yes (comment)   Criminal Activity/Legal Involvement  Pertinent to Current Situation/Hospitalization: No - Comment as needed  Activities of Daily Living Home Assistive Devices/Equipment: None ADL Screening (condition at time of admission) Patient's cognitive ability adequate to safely complete daily activities?: Yes Is the patient deaf or have difficulty hearing?: No Does the patient have difficulty seeing, even when wearing glasses/contacts?: No Does the patient have difficulty concentrating, remembering, or making decisions?: No Patient able to express need for assistance with ADLs?: Yes Does the patient have difficulty dressing or bathing?: No Independently performs ADLs?: Yes (appropriate for developmental age) Communication: Independent Dressing (OT): Independent Grooming: Independent Feeding: Independent Bathing: Independent Toileting: Independent In/Out Bed: Independent Walks in Home: Independent Does the patient have difficulty walking or climbing stairs?: No Weakness of Legs: None Weakness of Arms/Hands: None  Permission Sought/Granted   Permission granted to share information with : Yes, Verbal Permission Granted  Share Information with NAME: taylor, nellayor  Permission granted to share info w AGENCY: Home Health Agencies  Permission granted to share info w Relationship: (Daughter)  Permission granted to share info w Contact Information: [phone]  Emotional Assessment       Orientation: : Oriented to Self,Oriented to Place,Oriented to  Time,Oriented to Situation Alcohol / Substance Use: Not Applicable Psych Involvement: No (comment)  Admission diagnosis:  CHF (congestive heart failure) (HCC) [I50.9] Hypertensive emergency [I16.1] Patient Active Problem List   Diagnosis Date Noted  . Hypertensive emergency 08/04/2020  . Hypokalemia 08/04/2020  . Acute respiratory failure with hypoxia (HCC) 08/04/2020  .  CHF (congestive heart failure) (HCC) 08/03/2020   PCP:  Patient, No Pcp Per Pharmacy:   [id number] [place] - [address] AT [facility] [address] [place] [address] Phone: [phone] Fax: [phone]     Social Determinants of Health (SDOH) Interventions    Readmission Risk Interventions No flowsheet data found.

## 2020-08-05 NOTE — Progress Notes (Signed)
PROGRESS NOTE    Cassandra Johnson  ZOX:[id number] DOB: 1945/12/15 DOA: 08/03/2020 PCP: Patient, No Pcp Per    Brief Narrative:  75 year old female with a history of hypertension, admitted to the hospital with hypertensive emergency and acute respiratory failure secondary to pulmonary edema.  She was diuresed with intravenous Lasix.  She was noted to have elevated troponin and EKG changes.  She has been started on medical therapy and anticoagulation.  Ejection fraction 40-45%.  Cardiology consulted   Assessment & Plan:   Principal Problem:   CHF (congestive heart failure) (HCC) Active Problems:   Hypertensive emergency   Hypokalemia   Acute respiratory failure with hypoxia (HCC)   Acute respiratory failure with hypoxia -Secondary to pulmonary -Initial chest x-ray indicated signs of CHF -Follow-up x-rays have improved -Wean off oxygen as tolerated  Hypertensive emergency with acute systolic congestive heart failure -Echo shows ejection fraction of 40 to 45% with global hypokinesis -Initially started on nitroglycerin infusion, hydralazine, ARB, beta-blockers -We will need to hold further ARB for now due to rising creatinine -Patient was diuresed with intravenous Lasix and appears to be euvolemic at this time -Hold off on further diuretics for now  NSTEMI -Troponin peaked at 346 -EKG showed T wave inversions in the anterolateral leads -Discussed with Dr. Royann Shivers on-call for cardiology -Recommendations were to continue medical management with aspirin, beta-blockers, statin and heparin -She will complete 48 hours of heparin -we will request cardiology consultation in a.m. to assess if she will benefit from stress test versus cardiac cath  Hyperlipidemia -LDH of 156 -Started on Lipitor  Type 2 diabetes -Currently on sliding scale insulin -Blood sugar stable -A1c of 6.9  Acute kidney injury -Suspect is related to diuretics/ARB -Since she is not volume overloaded, will  hold off with diuretics, hold ARB -Repeat labs in a.m.   DVT prophylaxis: SCDs Start: 08/04/20 0147  Code Status: Full code Family Communication: Discussed with daughters at the bedside Disposition Plan: Status is: Inpatient  Remains inpatient appropriate because:Inpatient level of care appropriate due to severity of illness   Dispo: The patient is from: Home              Anticipated d/c is to: Home              Patient currently is not medically stable to d/c.   Difficult to place patient No    Consultants:     Procedures:   Echocardiogram: Ejection fraction 40 to 45% with global hypokinesis, moderate LVH  Antimicrobials:      Subjective: She denies any chest pain shortness of breath.  Objective: Vitals:   08/05/20 0738 08/05/20 0800 08/05/20 0900 08/05/20 1203  BP:  (!) 157/82 (!) 110/59   Pulse:  63 64   Resp:  11 14   Temp: 99 F (37.2 C)   98.5 F (36.9 C)  TempSrc: Oral   Oral  SpO2:  100% 100%   Weight:      Height:        Intake/Output Summary (Last 24 hours) at 08/05/2020 1227 Last data filed at 08/05/2020 0547 Gross per 24 hour  Intake 622.63 ml  Output 350 ml  Net 272.63 ml   Filed Weights   08/03/20 2036 08/05/20 0321  Weight: 90.7 kg 77.7 kg    Examination:  General exam: Appears calm and comfortable  Respiratory system: Clear to auscultation. Respiratory effort normal. Cardiovascular system: S1 & S2 heard, RRR. No JVD, murmurs, rubs, gallops or clicks. No pedal edema.  Gastrointestinal system: Abdomen is nondistended, soft and nontender. No organomegaly or masses felt. Normal bowel sounds heard. Central nervous system: Alert and oriented. No focal neurological deficits. Extremities: Symmetric 5 x 5 power. Skin: No rashes, lesions or ulcers Psychiatry: Judgement and insight appear normal. Mood & affect appropriate.     Data Reviewed: I have personally reviewed following labs and imaging studies  CBC: Recent Labs  Lab  08/03/20 2101 08/04/20 1119 08/05/20 0452  WBC 13.9* 11.8* 11.0*  NEUTROABS 6.8  --   --   HGB 15.3* 12.8 11.6*  HCT 48.1* 40.2 35.1*  MCV 86.7 86.1 84.2  PLT 247 224 191   Basic Metabolic Panel: Recent Labs  Lab 08/03/20 2101 08/04/20 0625 08/05/20 0452  NA 139 140 140  K 2.9* 4.6 4.3  CL 103 105 105  CO2 20* 24 24  GLUCOSE 249* 150* 138*  BUN 14 18 24*  CREATININE 1.16* 1.30* 1.85*  CALCIUM 8.9 9.0 8.8*  MG  --   --  1.8   GFR: Estimated Creatinine Clearance: 29.1 mL/min (A) (by C-G formula based on SCr of 1.85 mg/dL (H)). Liver Function Tests: Recent Labs  Lab 08/03/20 2101 08/04/20 0625  AST 28 27  ALT 16 16  ALKPHOS 86 73  BILITOT 0.3 0.3  PROT 8.4* 7.5  ALBUMIN 4.3 3.8   No results for input(s): LIPASE, AMYLASE in the last 168 hours. No results for input(s): AMMONIA in the last 168 hours. Coagulation Profile: No results for input(s): INR, PROTIME in the last 168 hours. Cardiac Enzymes: No results for input(s): CKTOTAL, CKMB, CKMBINDEX, TROPONINI in the last 168 hours. BNP (last 3 results) No results for input(s): PROBNP in the last 8760 hours. HbA1C: Recent Labs    08/03/20 2054  HGBA1C 6.9*   CBG: Recent Labs  Lab 08/04/20 1131 08/04/20 1637 08/04/20 1944 08/05/20 0732 08/05/20 1122  GLUCAP 117* 122* 105* 118* 123*   Lipid Profile: Recent Labs    08/04/20 0519  CHOL 266*  HDL 70  LDLCALC 156*  TRIG 198*  CHOLHDL 3.8   Thyroid Function Tests: Recent Labs    08/03/20 2054  TSH 3.396   Anemia Panel: No results for input(s): VITAMINB12, FOLATE, FERRITIN, TIBC, IRON, RETICCTPCT in the last 72 hours. Sepsis Labs: No results for input(s): PROCALCITON, LATICACIDVEN in the last 168 hours.  Recent Results (from the past 240 hour(s))  Resp Panel by RT-PCR (Flu A&B, Covid) Nasopharyngeal Swab     Status: None   Collection Time: 08/03/20  9:02 PM   Specimen: Nasopharyngeal Swab; Nasopharyngeal(NP) swabs in vial transport medium   Result Value Ref Range Status   SARS Coronavirus 2 by RT PCR NEGATIVE NEGATIVE Final    Comment: (NOTE) SARS-CoV-2 target nucleic acids are NOT DETECTED.  The SARS-CoV-2 RNA is generally detectable in upper respiratory specimens during the acute phase of infection. The lowest concentration of SARS-CoV-2 viral copies this assay can detect is 138 copies/mL. A negative result does not preclude SARS-Cov-2 infection and should not be used as the sole basis for treatment or other patient management decisions. A negative result may occur with  improper specimen collection/handling, submission of specimen other than nasopharyngeal swab, presence of viral mutation(s) within the areas targeted by this assay, and inadequate number of viral copies(<138 copies/mL). A negative result must be combined with clinical observations, patient history, and epidemiological information. The expected result is Negative.  Fact Sheet for Patients:  BloggerCourse.comhttps://www.fda.gov/media/152166/download  Fact Sheet for Healthcare Providers:  SeriousBroker.ithttps://www.fda.gov/media/152162/download  This test is no t yet approved or cleared by the [place] and  has been authorized for detection and/or diagnosis of SARS-CoV-2 by FDA under an Emergency Use Authorization (EUA). This EUA will remain  in effect (meaning this test can be used) for the duration of the COVID-19 declaration under Section 564(b)(1) of the Act, 21 U.S.C.section 360bbb-3(b)(1), unless the authorization is terminated  or revoked sooner.       Influenza A by PCR NEGATIVE NEGATIVE Final   Influenza B by PCR NEGATIVE NEGATIVE Final    Comment: (NOTE) The Xpert Xpress SARS-CoV-2/FLU/RSV plus assay is intended as an aid in the diagnosis of influenza from Nasopharyngeal swab specimens and should not be used as a sole basis for treatment. Nasal washings and aspirates are unacceptable for Xpert Xpress SARS-CoV-2/FLU/RSV testing.  Fact Sheet for  Patients: BloggerCourse.com  Fact Sheet for Healthcare Providers: SeriousBroker.it  This test is not yet approved or cleared by the [place] FDA and has been authorized for detection and/or diagnosis of SARS-CoV-2 by FDA under an Emergency Use Authorization (EUA). This EUA will remain in effect (meaning this test can be used) for the duration of the COVID-19 declaration under Section 564(b)(1) of the Act, 21 U.S.C. section 360bbb-3(b)(1), unless the authorization is terminated or revoked.  Performed at [facility], [address], [place], [place] [address]   MRSA PCR Screening     Status: None   Collection Time: 08/04/20  2:11 AM   Specimen: Nasal Mucosa; Nasopharyngeal  Result Value Ref Range Status   MRSA by PCR NEGATIVE NEGATIVE Final    Comment:        The GeneXpert MRSA Assay (FDA approved for NASAL specimens only), is one component of a comprehensive MRSA colonization surveillance program. It is not intended to diagnose MRSA infection nor to guide or monitor treatment for MRSA infections. Performed at [facility], [address] [place], [place] [address]          Radiology Studies: [facility] Chest [facility] 1 View  Result Date: 08/04/2020 CLINICAL DATA:  CHF EXAM: PORTABLE CHEST 1 VIEW COMPARISON:  Yesterday FINDINGS: Resolved pulmonary edema. Stable generous heart size with aortic tortuosity. No visible effusion or pneumothorax. IMPRESSION: Resolved pulmonary edema. Electronically Signed   By: Marnee Spring M.D.   On: 08/04/2020 05:56   DG Chest Port 1 View  Result Date: 08/03/2020 CLINICAL DATA:  Shortness of breath, respiratory distress EXAM: PORTABLE CHEST 1 VIEW COMPARISON:  None. FINDINGS: Diffuse hazy perihilar and basilar predominant opacities in both lungs with indistinct pulmonary vascularity. No pneumothorax or visible effusion. Enlarged cardiomediastinal silhouette. No acute osseous or soft tissue  abnormality. Degenerative changes are present in the imaged spine and shoulders. Telemetry leads overlie the chest. IMPRESSION: Cardiomegaly with diffuse hazy perihilar and basilar predominant opacities, most likely edema/CHF. Infection not fully excluded. Electronically Signed   By: Kreg Shropshire M.D.   On: 08/03/2020 21:04   ECHOCARDIOGRAM COMPLETE  Result Date: 08/04/2020    ECHOCARDIOGRAM REPORT   Patient Name:   Cassandra [place] Date of Exam: 08/04/2020 Medical Rec #:  [id number]         Height:       67.0 in Accession #:    [id number]        Weight:       200.0 lb Date of Birth:  1945/10/20        BSA:          2.022 m Patient Age:  74 years          BP:           192/104 mmHg Patient Gender: F                 HR:           85 bpm. [facility]am [facility] Procedure: 2D Echo Indications:    Congestive Heart Failure I50.9  History:        Patient has no prior history of Echocardiogram examinations.                 CHF; Risk Factors:Hypertension and Non-Smoker. Acute Respiratory                 Failure.  Sonographer:    Jeryl Columbia RDCS (AE) Referring Phys: [id number] ASIA B ZIERLE-GHOSH IMPRESSIONS  1. Left ventricular ejection fraction, by estimation, is 40 to 45%. The left ventricle has mildly decreased function. The left ventricle demonstrates global hypokinesis. There is moderate concentric left ventricular hypertrophy. Left ventricular diastolic parameters are indeterminate.  2. Right ventricular systolic function is normal. The right ventricular size is normal.  3. Left atrial size was mildly dilated.  4. The mitral valve is grossly normal. Trivial mitral valve regurgitation. Moderate to severe mitral annular calcification.  5. The aortic valve is tricuspid. Aortic valve regurgitation is not visualized. No aortic stenosis is present.  6. The inferior vena cava is normal in size with greater than 50% respiratory variability, suggesting right atrial pressure of 3 mmHg. Comparison(s): No prior  Echocardiogram. Conclusion(s)/Recommendation(s): EF 40-45% with global hypokinesis, moderate LVH. No significant valve disease. FINDINGS  Left Ventricle: Left ventricular ejection fraction, by estimation, is 40 to 45%. The left ventricle has mildly decreased function. The left ventricle demonstrates global hypokinesis. The left ventricular internal cavity size was normal in size. There is  moderate concentric left ventricular hypertrophy. Left ventricular diastolic parameters are indeterminate. Right Ventricle: The right ventricular size is normal. No increase in right ventricular wall thickness. Right ventricular systolic function is normal. Left Atrium: Left atrial size was mildly dilated. Right Atrium: Right atrial size was normal in size. Pericardium: There is no evidence of pericardial effusion. Mitral Valve: The mitral valve is grossly normal. There is mild thickening of the mitral valve leaflet(s). There is mild calcification of the mitral valve leaflet(s). Moderate to severe mitral annular calcification. Trivial mitral valve regurgitation. Tricuspid Valve: The tricuspid valve is normal in structure. Tricuspid valve regurgitation is trivial. No evidence of tricuspid stenosis. Aortic Valve: The aortic valve is tricuspid. Aortic valve regurgitation is not visualized. No aortic stenosis is present. Pulmonic Valve: The pulmonic valve was grossly normal. Pulmonic valve regurgitation is not visualized. Aorta: The aortic root, ascending aorta and aortic arch are all structurally normal, with no evidence of dilitation or obstruction. Venous: The inferior vena cava is normal in size with greater than 50% respiratory variability, suggesting right atrial pressure of 3 mmHg. IAS/Shunts: The atrial septum is grossly normal.  LEFT VENTRICLE PLAX 2D LVIDd:         3.21 cm  Diastology LVIDs:         2.66 cm  LV e' medial:    3.38 cm/s LV PW:         1.58 cm  LV E/e' medial:  19.7 LV IVS:        1.58 cm  LV e' lateral:   4.80  cm/s LVOT diam:     2.00 cm  LV  E/e' lateral: 13.9 LVOT Area:     3.14 cm  RIGHT VENTRICLE RV S prime:     15.60 cm/s LEFT ATRIUM             Index       RIGHT ATRIUM           Index LA diam:        2.80 cm 1.38 cm/m  RA Area:     13.70 cm LA Vol (A2C):   56.6 ml 27.99 ml/m RA Volume:   33.70 ml  16.66 ml/m LA Vol (A4C):   59.2 ml 29.27 ml/m LA Biplane Vol: 58.8 ml 29.08 ml/m   AORTA Ao Root diam: 3.10 cm MITRAL VALVE MV Area (PHT): 3.72 cm    SHUNTS MV Decel Time: 204 msec    Systemic Diam: 2.00 cm MV E velocity: 66.70 cm/s MV A velocity: 90.30 cm/s MV E/A ratio:  0.74 Jodelle Red MD Electronically signed by Jodelle Red MD Signature Date/Time: 08/04/2020/1:58:11 PM    Final         Scheduled Meds: . aspirin  325 mg Oral Daily  . atorvastatin  40 mg Oral q1800  . carvedilol  6.25 mg Oral BID WC  . Chlorhexidine Gluconate Cloth  6 each Topical Daily  . insulin aspart  0-15 Units Subcutaneous TID WC  . insulin aspart  0-5 Units Subcutaneous QHS   Continuous Infusions: . heparin 1,300 Units/hr (08/05/20 0547)  . lactated ringers       LOS: 2 days    Time spent: 35 mins    Erick Blinks, MD [facility]   If 7PM-7AM, please contact night-coverage [email or web address]  08/05/2020, 12:27 PM

## 2020-08-06 ENCOUNTER — Encounter (HOSPITAL_COMMUNITY): Payer: Self-pay | Admitting: Family Medicine

## 2020-08-06 DIAGNOSIS — J810 Acute pulmonary edema: Secondary | ICD-10-CM

## 2020-08-06 DIAGNOSIS — E785 Hyperlipidemia, unspecified: Secondary | ICD-10-CM

## 2020-08-06 DIAGNOSIS — I429 Cardiomyopathy, unspecified: Secondary | ICD-10-CM

## 2020-08-06 DIAGNOSIS — E1169 Type 2 diabetes mellitus with other specified complication: Secondary | ICD-10-CM

## 2020-08-06 DIAGNOSIS — I5041 Acute combined systolic (congestive) and diastolic (congestive) heart failure: Secondary | ICD-10-CM

## 2020-08-06 LAB — CBC
HCT: 35.6 % — ABNORMAL LOW (ref 36.0–46.0)
Hemoglobin: 11.7 g/dL — ABNORMAL LOW (ref 12.0–15.0)
MCH: 27.7 pg (ref 26.0–34.0)
MCHC: 32.9 g/dL (ref 30.0–36.0)
MCV: 84.2 fL (ref 80.0–100.0)
Platelets: 198 10*3/uL (ref 150–400)
RBC: 4.23 MIL/uL (ref 3.87–5.11)
RDW: 14.4 % (ref 11.5–15.5)
WBC: 9.5 10*3/uL (ref 4.0–10.5)
nRBC: 0 % (ref 0.0–0.2)

## 2020-08-06 LAB — BASIC METABOLIC PANEL
Anion gap: 9 (ref 5–15)
BUN: 26 mg/dL — ABNORMAL HIGH (ref 8–23)
CO2: 25 mmol/L (ref 22–32)
Calcium: 8.6 mg/dL — ABNORMAL LOW (ref 8.9–10.3)
Chloride: 104 mmol/L (ref 98–111)
Creatinine, Ser: 1.66 mg/dL — ABNORMAL HIGH (ref 0.44–1.00)
GFR, Estimated: 32 mL/min — ABNORMAL LOW (ref >60)
Glucose, Bld: 109 mg/dL — ABNORMAL HIGH (ref 70–99)
Potassium: 4.2 mmol/L (ref 3.5–5.1)
Sodium: 138 mmol/L (ref 135–145)

## 2020-08-06 LAB — GLUCOSE, CAPILLARY
Glucose-Capillary: 124 mg/dL — ABNORMAL HIGH (ref 70–99)
Glucose-Capillary: 114 mg/dL — ABNORMAL HIGH (ref 70–99)
Glucose-Capillary: 85 mg/dL (ref 70–99)
Glucose-Capillary: 134 mg/dL — ABNORMAL HIGH (ref 70–99)

## 2020-08-06 LAB — HEPARIN LEVEL (UNFRACTIONATED): Heparin Unfractionated: 0.46 IU/mL (ref 0.30–0.70)

## 2020-08-06 MED ORDER — ASPIRIN EC 81 MG PO TBEC: 81.0000 mg | DELAYED_RELEASE_TABLET | Freq: Every day | ORAL | Status: DC

## 2020-08-06 MED ORDER — SODIUM CHLORIDE 0.9 % IV SOLN: 250.0000 mL | INTRAVENOUS | Status: DC | PRN

## 2020-08-06 MED ORDER — ASPIRIN 81 MG PO CHEW
81.0000 mg | CHEWABLE_TABLET | ORAL | Status: AC
  Administered 2020-08-07: 81 mg via ORAL
  Filled 2020-08-06: qty 1

## 2020-08-06 MED ORDER — SODIUM CHLORIDE 0.9% FLUSH
3.0000 mL | Freq: Two times a day (BID) | INTRAVENOUS | Status: DC
  Administered 2020-08-06: 3 mL via INTRAVENOUS

## 2020-08-06 MED ORDER — CARVEDILOL 12.5 MG PO TABS
12.5000 mg | ORAL_TABLET | Freq: Two times a day (BID) | ORAL | Status: DC
  Administered 2020-08-06 – 2020-08-08 (×4): 12.5 mg via ORAL
  Filled 2020-08-06 (×4): qty 1

## 2020-08-06 MED ORDER — ISOSORBIDE MONONITRATE ER 30 MG PO TB24
30.0000 mg | ORAL_TABLET | Freq: Every day | ORAL | Status: DC
  Administered 2020-08-06 – 2020-08-09 (×3): 30 mg via ORAL
  Filled 2020-08-06 (×3): qty 1

## 2020-08-06 MED ORDER — SODIUM CHLORIDE 0.9 % IV SOLN: INTRAVENOUS | Status: DC

## 2020-08-06 MED ORDER — SODIUM CHLORIDE 0.9% FLUSH: 3.0000 mL | INTRAVENOUS | Status: DC | PRN

## 2020-08-06 NOTE — Progress Notes (Signed)
PROGRESS NOTE    Cassandra Johnson  WLN:[id number] DOB: 26-Jul-1945 DOA: 08/03/2020 PCP: Patient, No Pcp Per    Brief Narrative:  75 year old female with a history of hypertension, admitted to the hospital with hypertensive emergency and acute respiratory failure secondary to pulmonary edema.  She was diuresed with intravenous Lasix.  She was noted to have elevated troponin and EKG changes.  She has been started on medical therapy and anticoagulation.  Ejection fraction 40-45%.  Cardiology consulted.  Plans are for transfer to [facility] for cardiac cath   Assessment & Plan:   Principal Problem:   CHF (congestive heart failure) (HCC) Active Problems:   Hypertensive emergency   Hypokalemia   Acute respiratory failure with hypoxia (HCC)   AKI (acute kidney injury) (HCC)   NSTEMI (non-ST elevated myocardial infarction) (HCC)   HLD (hyperlipidemia)   DM2 (diabetes mellitus, type 2) (HCC)   Acute respiratory failure with hypoxia -Secondary to pulmonary -Initial chest x-ray indicated signs of CHF -Follow-up x-rays have improved -Wean off oxygen as tolerated  Hypertensive emergency with acute systolic congestive heart failure -Echo shows ejection fraction of 40 to 45% with global hypokinesis -Initially started on nitroglycerin infusion, hydralazine, ARB, beta-blockers -We will need to hold further ARB for now due to rising creatinine -Patient was diuresed with intravenous Lasix and appears to be euvolemic at this time -Hold off on further diuretics for now  NSTEMI -Troponin peaked at 346 -EKG showed T wave inversions in the anterolateral leads -Currently on medical management with aspirin, beta-blockers, statin and IV heparin -Appreciate cardiology input -Plan will be to transfer to [facility] for cardiac cath once creatinine has stabilized  Hyperlipidemia -LDH of 156 -Started on Lipitor  Type 2 diabetes -New diagnosis -Currently on sliding scale insulin -Blood sugar  stable -A1c of 6.9  Acute kidney injury -Suspect is related to diuretics/ARB -Since she is not volume overloaded, will hold off with diuretics, hold ARB -Repeat labs in a.m.   DVT prophylaxis: SCDs Start: 08/04/20 0147  Code Status: Full code Family Communication: Discussed with daughters at the bedside Disposition Plan: Status is: Inpatient.  Transfer to [facility] for further evaluation with cardiac cath  Remains inpatient appropriate because:Inpatient level of care appropriate due to severity of illness   Dispo: The patient is from: Home              Anticipated d/c is to: Home              Patient currently is not medically stable to d/c.   Difficult to place patient No    Consultants:   Cardiology  Procedures:   Echocardiogram: Ejection fraction 40 to 45% with global hypokinesis, moderate LVH  Antimicrobials:      Subjective: Patient is in good spirits this morning.  Denies any chest pain or shortness of breath.  Objective: Vitals:   08/06/20 1700 08/06/20 1730 08/06/20 1747 08/06/20 1750  BP:    (!) 185/99  Pulse: 63 64 72 72  Resp: 17 20 20 17   Temp:      TempSrc:      SpO2: 97% 97% 99% 98%  Weight:      Height:        Intake/Output Summary (Last 24 hours) at 08/06/2020 1805 Last data filed at 08/06/2020 1752 Gross per 24 hour  Intake 1427.28 ml  Output 700 ml  Net 727.28 ml   Filed Weights   08/03/20 2036 08/05/20 0321 08/06/20 0500  Weight: 90.7 kg  77.7 kg 77.6 kg    Examination:  General exam: Alert, awake, oriented x 3 Respiratory system: Clear to auscultation. Respiratory effort normal. Cardiovascular system:RRR. No murmurs, rubs, gallops. Gastrointestinal system: Abdomen is nondistended, soft and nontender. No organomegaly or masses felt. Normal bowel sounds heard. Central nervous system: Alert and oriented. No focal neurological deficits. Extremities: No C/C/E, +pedal pulses Skin: No rashes, lesions or ulcers Psychiatry:  Judgement and insight appear normal. Mood & affect appropriate.      Data Reviewed: I have personally reviewed following labs and imaging studies  CBC: Recent Labs  Lab 08/03/20 2101 08/04/20 1119 08/05/20 0452 08/06/20 0517  WBC 13.9* 11.8* 11.0* 9.5  NEUTROABS 6.8  --   --   --   HGB 15.3* 12.8 11.6* 11.7*  HCT 48.1* 40.2 35.1* 35.6*  MCV 86.7 86.1 84.2 84.2  PLT 247 224 191 198   Basic Metabolic Panel: Recent Labs  Lab 08/03/20 2101 08/04/20 0625 08/05/20 0452 08/06/20 0842  NA 139 140 140 138  K 2.9* 4.6 4.3 4.2  CL 103 105 105 104  CO2 20* 24 24 25   GLUCOSE 249* 150* 138* 109*  BUN 14 18 24* 26*  CREATININE 1.16* 1.30* 1.85* 1.66*  CALCIUM 8.9 9.0 8.8* 8.6*  MG  --   --  1.8  --    GFR: Estimated Creatinine Clearance: 32.4 mL/min (A) (by C-G formula based on SCr of 1.66 mg/dL (H)). Liver Function Tests: Recent Labs  Lab 08/03/20 2101 08/04/20 0625  AST 28 27  ALT 16 16  ALKPHOS 86 73  BILITOT 0.3 0.3  PROT 8.4* 7.5  ALBUMIN 4.3 3.8   No results for input(s): LIPASE, AMYLASE in the last 168 hours. No results for input(s): AMMONIA in the last 168 hours. Coagulation Profile: No results for input(s): INR, PROTIME in the last 168 hours. Cardiac Enzymes: No results for input(s): CKTOTAL, CKMB, CKMBINDEX, TROPONINI in the last 168 hours. BNP (last 3 results) No results for input(s): PROBNP in the last 8760 hours. HbA1C: Recent Labs    08/03/20 2054  HGBA1C 6.9*   CBG: Recent Labs  Lab 08/05/20 1602 08/05/20 2035 08/06/20 0732 08/06/20 1119 08/06/20 1647  GLUCAP 108* 113* 124* 114* 85   Lipid Profile: Recent Labs    08/04/20 0519  CHOL 266*  HDL 70  LDLCALC 156*  TRIG 198*  CHOLHDL 3.8   Thyroid Function Tests: Recent Labs    08/03/20 2054  TSH 3.396   Anemia Panel: No results for input(s): VITAMINB12, FOLATE, FERRITIN, TIBC, IRON, RETICCTPCT in the last 72 hours. Sepsis Labs: No results for input(s): PROCALCITON,  LATICACIDVEN in the last 168 hours.  Recent Results (from the past 240 hour(s))  Resp Panel by RT-PCR (Flu A&B, Covid) Nasopharyngeal Swab     Status: None   Collection Time: 08/03/20  9:02 PM   Specimen: Nasopharyngeal Swab; Nasopharyngeal(NP) swabs in vial transport medium  Result Value Ref Range Status   SARS Coronavirus 2 by RT PCR NEGATIVE NEGATIVE Final    Comment: (NOTE) SARS-CoV-2 target nucleic acids are NOT DETECTED.  The SARS-CoV-2 RNA is generally detectable in upper respiratory specimens during the acute phase of infection. The lowest concentration of SARS-CoV-2 viral copies this assay can detect is 138 copies/mL. A negative result does not preclude SARS-Cov-2 infection and should not be used as the sole basis for treatment or other patient management decisions. A negative result may occur with  improper specimen collection/handling, submission of specimen other than nasopharyngeal swab,  presence of viral mutation(s) within the areas targeted by this assay, and inadequate number of viral copies(<138 copies/mL). A negative result must be combined with clinical observations, patient history, and epidemiological information. The expected result is Negative.  Fact Sheet for Patients:  BloggerCourse.com  Fact Sheet for Healthcare Providers:  SeriousBroker.it  This test is no t yet approved or cleared by the [place] FDA and  has been authorized for detection and/or diagnosis of SARS-CoV-2 by FDA under an Emergency Use Authorization (EUA). This EUA will remain  in effect (meaning this test can be used) for the duration of the COVID-19 declaration under Section 564(b)(1) of the Act, 21 U.S.C.section 360bbb-3(b)(1), unless the authorization is terminated  or revoked sooner.       Influenza A by PCR NEGATIVE NEGATIVE Final   Influenza B by PCR NEGATIVE NEGATIVE Final    Comment: (NOTE) The Xpert Xpress  SARS-CoV-2/FLU/RSV plus assay is intended as an aid in the diagnosis of influenza from Nasopharyngeal swab specimens and should not be used as a sole basis for treatment. Nasal washings and aspirates are unacceptable for Xpert Xpress SARS-CoV-2/FLU/RSV testing.  Fact Sheet for Patients: BloggerCourse.com  Fact Sheet for Healthcare Providers: SeriousBroker.it  This test is not yet approved or cleared by the [place] FDA and has been authorized for detection and/or diagnosis of SARS-CoV-2 by FDA under an Emergency Use Authorization (EUA). This EUA will remain in effect (meaning this test can be used) for the duration of the COVID-19 declaration under Section 564(b)(1) of the Act, 21 U.S.C. section 360bbb-3(b)(1), unless the authorization is terminated or revoked.  Performed at [facility], [address], [place], [place] [address]   MRSA PCR Screening     Status: None   Collection Time: 08/04/20  2:11 AM   Specimen: Nasal Mucosa; Nasopharyngeal  Result Value Ref Range Status   MRSA by PCR NEGATIVE NEGATIVE Final    Comment:        The GeneXpert MRSA Assay (FDA approved for NASAL specimens only), is one component of a comprehensive MRSA colonization surveillance program. It is not intended to diagnose MRSA infection nor to guide or monitor treatment for MRSA infections. Performed at [facility], [address], [place], [place] [address]          Radiology Studies: No results found.      Scheduled Meds: . aspirin EC  81 mg Oral Daily  . atorvastatin  40 mg Oral q1800  . carvedilol  12.5 mg Oral BID WC  . Chlorhexidine Gluconate Cloth  6 each Topical Daily  . insulin aspart  0-15 Units Subcutaneous TID WC  . insulin aspart  0-5 Units Subcutaneous QHS  . isosorbide mononitrate  30 mg Oral Daily   Continuous Infusions: . heparin 1,300 Units/hr (08/06/20 1752)     LOS: 3 days    Time spent: 35  mins    Erick Blinks, MD [facility]   If 7PM-7AM, please contact night-coverage [email or web address]  08/06/2020, 6:05 PM

## 2020-08-06 NOTE — Progress Notes (Signed)
ANTICOAGULATION CONSULT NOTE -   Pharmacy Consult for Heparin Indication: chest pain/ACS  No Known Allergies  Patient Measurements: Height: 5\' 7"  (170.2 cm) Weight: 77.6 kg (171 lb 1.2 oz) IBW/kg (Calculated) : 61.6 HEPARIN DW (KG): 81.1  Vital Signs: Temp: 98.8 F (37.1 C) (03/07 0733) Temp Source: Oral (03/07 0733) BP: 138/73 (03/07 0700) Pulse Rate: 69 (03/06 2100)  Labs: Recent Labs    08/03/20 2101 08/03/20 2251 08/04/20 0625 08/04/20 1119 08/04/20 1331 08/04/20 1940 08/05/20 0452 08/05/20 1250 08/06/20 0517  HGB 15.3*  --   --  12.8  --   --  11.6*  --  11.7*  HCT 48.1*  --   --  40.2  --   --  35.1*  --  35.6*  PLT 247  --   --  224  --   --  191  --  198  HEPARINUNFRC  --   --   --   --   --    < > 0.62 0.60 0.46  CREATININE 1.16*  --  1.30*  --   --   --  1.85*  --   --   TROPONINIHS 15   < > 346* 304* 245*  --   --   --   --    < > = values in this interval not displayed.    Estimated Creatinine Clearance: 29.1 mL/min (A) (by C-G formula based on SCr of 1.85 mg/dL (H)).   Medical History: Past Medical History:  Diagnosis Date   Hypertension     Medications:  No medications prior to admission.    Assessment: Patient admitted with sudden onset shortness of breath. It was non exertional. She also had chest pressure in the middle of her chest but it did not radiate. Troponins are elevated and MD asked to start heparin. Reviewed medications and not on any oral anticoagulants.  Heparin level is 0.46, therapeutic. No issues with infusion hgb 15.3> 11.6> 11.7, stable. No signs of bleeding, MD notified and monitoring  Goal of Therapy:  Heparin level 0.3-0.7 units/ml Monitor platelets by anticoagulation protocol: Yes   Plan:  Continue with heparin infusion at 1300 units/hr Check anti-Xa level  daily while on heparin Continue to monitor H&H and platelets  10/06/20, BS Elder Cyphers, BCPS Clinical Pharmacist Pager [phone] 08/06/2020,7:44 AM

## 2020-08-06 NOTE — Plan of Care (Signed)

## 2020-08-06 NOTE — H&P (View-Only) (Signed)
Cardiology Consultation:   Patient ID: Cassandra Johnson MRN: [id number]; DOB: 12/23/1945  Admit date: 08/03/2020 Date of Consult: 08/06/2020  PCP:  Patient, No Pcp Per   [facility]  Consulting Cardiologist:  Dalesha Stanback, MD (New)   Patient Profile:   Cassandra Johnson is a 75 y.o. female with a history of HTN (untreated, on no medications or with follow-up by physician since 2009), recently diagnosed type 2 diabetes mellitus with hemoglobin A1c 6.9% presenting now with hypertensive emergency associated with flash pulmonary edema.  Cardiology consultation requested by Dr Memon.  Prior to admission, patient states not taking medications because Jesus was protecting her. No Covid vaccine for same reason.  History of Present Illness:   Cassandra Johnson was admitted 03/05 with HTN emergency, peak blood pressure 248/157, and was in pulmonary edema. She was diuresed with IV Lasix. Her EF was reduced and her troponin elevated, cards asked to see.  Ms. Granderson states that she never gets chest pain.  She lives alone and does things around the house without any difficulty.  She had not been having problems with lower extremity edema, orthopnea or PND.  On Saturday night, she ate dinner she admits probably had more salt than she should have, and sat down on the couch and realized something was wrong.  The shortness of breath was extremely sudden onset.  Her blood pressure improved with IV nitroglycerin with IV Lasix.  She has been transitioned to p.o. medications.  She has been diuresed, and her shortness of breath is improved.  Her creatinine increased so the diuretics and ARB have been discontinued.  She feels her breathing is back to baseline.  She feels well.  Her blood pressure is coming under better control.  She was not on any medications prior to admission greatest to take them.  She claims to be following her blood pressure at home and will do daily weights.  Her  daughter will help her with a low-sodium diet.   Past Medical History:  Diagnosis Date  . Hypertension     Past Surgical History:  Procedure Laterality Date  . No prior surgeries       Inpatient Medications: Scheduled Meds: . aspirin  325 mg Oral Daily  . atorvastatin  40 mg Oral q1800  . carvedilol  6.25 mg Oral BID WC  . Chlorhexidine Gluconate Cloth  6 each Topical Daily  . insulin aspart  0-15 Units Subcutaneous TID WC  . insulin aspart  0-5 Units Subcutaneous QHS   Continuous Infusions: . heparin 1,300 Units/hr (08/06/20 0959)   PRN Meds: acetaminophen **OR** acetaminophen, ondansetron **OR** ondansetron (ZOFRAN) IV, oxyCODONE, polyethylene glycol  Allergies:   No Known Allergies  Social History:   Social History   Tobacco Use  . Smoking status: Never Smoker  . Smokeless tobacco: Never Used  Substance Use Topics  . Alcohol use: Not Currently    Family History  Problem Relation Age of Onset  . Unexplained death Mother        Natural causes, died in her 70s  . Diabetes Father        Died in his 70s     ROS:  Please see the history of present illness.  All other ROS reviewed and negative.     Physical Exam/Data:   Vitals:   08/06/20 0700 08/06/20 0733 08/06/20 0800 08/06/20 0930  BP: 138/73   (!) 153/84  Pulse:      Resp: 19 12 15 14    Temp:  98.8 F (37.1 C)    TempSrc:  Oral    SpO2:      Weight:      Height:        Intake/Output Summary (Last 24 hours) at 08/06/2020 1011 Last data filed at 08/06/2020 0959 Gross per 24 hour  Intake 1333.41 ml  Output 1150 ml  Net 183.41 ml   Last 3 Weights 08/06/2020 08/05/2020 08/03/2020  Weight (lbs) 171 lb 1.2 oz 171 lb 4.8 oz 200 lb  Weight (kg) 77.6 kg 77.7 kg 90.719 kg     Body mass index is 26.79 kg/m.  General:  Well nourished, well developed, in no acute distress HEENT: normal Lymph: no adenopathy Neck: minimal JVD Endocrine:  No thryomegaly Vascular: No carotid bruits; FA pulses 2+ bilaterally  without bruits  Cardiac:  normal S1, S2; RRR; no murmur Lungs: generally clear to auscultation bilaterally, no wheezing, rhonchi,  few rales bases Abd: soft, nontender, no hepatomegaly  Ext: no edema Musculoskeletal:  No deformities, BUE and BLE strength normal and equal Skin: warm and dry  Neuro:  CNs 2-12 intact, no focal abnormalities noted Psych:  Normal affect   EKG:  The EKG was personally reviewed and demonstrates: 03/05 ECG is SR, HR 84, lateral T wave inversions different from 03/04 ECG Telemetry:  Telemetry was personally reviewed and demonstrates:  SR   Laboratory Data:  High Sensitivity Troponin:   Recent Labs  Lab 08/03/20 2101 08/03/20 2251 08/04/20 0625 08/04/20 1119 08/04/20 1331  TROPONINIHS 15 64* 346* 304* 245*     Chemistry Recent Labs  Lab 08/04/20 0625 08/05/20 0452 08/06/20 0842  NA 140 140 138  K 4.6 4.3 4.2  CL 105 105 104  CO2 24 24 25   GLUCOSE 150* 138* 109*  BUN 18 24* 26*  CREATININE 1.30* 1.85* 1.66*  CALCIUM 9.0 8.8* 8.6*  GFRNONAA 43* 28* 32*  ANIONGAP 11 11 9     Recent Labs  Lab 08/03/20 2101 08/04/20 0625  PROT 8.4* 7.5  ALBUMIN 4.3 3.8  AST 28 27  ALT 16 16  ALKPHOS 86 73  BILITOT 0.3 0.3   Hematology Recent Labs  Lab 08/04/20 1119 08/05/20 0452 08/06/20 0517  WBC 11.8* 11.0* 9.5  RBC 4.67 4.17 4.23  HGB 12.8 11.6* 11.7*  HCT 40.2 35.1* 35.6*  MCV 86.1 84.2 84.2  MCH 27.4 27.8 27.7  MCHC 31.8 33.0 32.9  RDW 14.4 14.4 14.4  PLT 224 191 198   BNP Recent Labs  Lab 08/03/20 2052  BNP 278.0*     Lab Results  Component Value Date   TSH 3.396 08/03/2020   Lab Results  Component Value Date   HGBA1C 6.9 (H) 08/03/2020   Lab Results  Component Value Date   CHOL 266 (H) 08/04/2020   HDL 70 08/04/2020   LDLCALC 156 (H) 08/04/2020   TRIG 198 (H) 08/04/2020   CHOLHDL 3.8 08/04/2020     Radiology/Studies:  DG Chest Port 1 View  Result Date: 08/04/2020 CLINICAL DATA:  CHF EXAM: PORTABLE CHEST 1 VIEW  COMPARISON:  Yesterday FINDINGS: Resolved pulmonary edema. Stable generous heart size with aortic tortuosity. No visible effusion or pneumothorax. IMPRESSION: Resolved pulmonary edema. Electronically Signed   By: Marnee SpringJonathon  Watts M.D.   On: 08/04/2020 05:56   DG Chest Port 1 View  Result Date: 08/03/2020 CLINICAL DATA:  Shortness of breath, respiratory distress EXAM: PORTABLE CHEST 1 VIEW COMPARISON:  None. FINDINGS: Diffuse hazy perihilar and basilar predominant opacities in both lungs  with indistinct pulmonary vascularity. No pneumothorax or visible effusion. Enlarged cardiomediastinal silhouette. No acute osseous or soft tissue abnormality. Degenerative changes are present in the imaged spine and shoulders. Telemetry leads overlie the chest. IMPRESSION: Cardiomegaly with diffuse hazy perihilar and basilar predominant opacities, most likely edema/CHF. Infection not fully excluded. Electronically Signed   By: Kreg Shropshire M.D.   On: 08/03/2020 21:04   ECHOCARDIOGRAM COMPLETE  Result Date: 08/04/2020    ECHOCARDIOGRAM REPORT   Patient Name:   ADINE HEIMANN Date of Exam: 08/04/2020 Medical Rec #:  [id number]         Height:       67.0 in Accession #:    [id number]        Weight:       200.0 lb Date of Birth:  May 10, 1946        BSA:          2.022 m Patient Age:    74 years          BP:           192/104 mmHg Patient Gender: F                 HR:           85 bpm. [facility] Procedure: 2D Echo Indications:    Congestive Heart Failure I50.9  History:        Patient has no prior history of Echocardiogram examinations.                 CHF; Risk Factors:Hypertension and Non-Smoker. Acute Respiratory                 Failure.  Sonographer:    Jeryl Columbia RDCS (AE) Referring Phys: [id number] ASIA B ZIERLE-GHOSH IMPRESSIONS  1. Left ventricular ejection fraction, by estimation, is 40 to 45%. The left ventricle has mildly decreased function. The left ventricle demonstrates global hypokinesis. There is  moderate concentric left ventricular hypertrophy. Left ventricular diastolic parameters are indeterminate.  2. Right ventricular systolic function is normal. The right ventricular size is normal.  3. Left atrial size was mildly dilated.  4. The mitral valve is grossly normal. Trivial mitral valve regurgitation. Moderate to severe mitral annular calcification.  5. The aortic valve is tricuspid. Aortic valve regurgitation is not visualized. No aortic stenosis is present.  6. The inferior vena cava is normal in size with greater than 50% respiratory variability, suggesting right atrial pressure of 3 mmHg. Comparison(s): No prior Echocardiogram. Conclusion(s)/Recommendation(s): EF 40-45% with global hypokinesis, moderate LVH. No significant valve disease. FINDINGS  Left Ventricle: Left ventricular ejection fraction, by estimation, is 40 to 45%. The left ventricle has mildly decreased function. The left ventricle demonstrates global hypokinesis. The left ventricular internal cavity size was normal in size. There is  moderate concentric left ventricular hypertrophy. Left ventricular diastolic parameters are indeterminate. Right Ventricle: The right ventricular size is normal. No increase in right ventricular wall thickness. Right ventricular systolic function is normal. Left Atrium: Left atrial size was mildly dilated. Right Atrium: Right atrial size was normal in size. Pericardium: There is no evidence of pericardial effusion. Mitral Valve: The mitral valve is grossly normal. There is mild thickening of the mitral valve leaflet(s). There is mild calcification of the mitral valve leaflet(s). Moderate to severe mitral annular calcification. Trivial mitral valve regurgitation. Tricuspid Valve: The tricuspid valve is normal in structure. Tricuspid valve regurgitation is trivial. No evidence of tricuspid stenosis. Aortic Valve: The  aortic valve is tricuspid. Aortic valve regurgitation is not visualized. No aortic stenosis is  present. Pulmonic Valve: The pulmonic valve was grossly normal. Pulmonic valve regurgitation is not visualized. Aorta: The aortic root, ascending aorta and aortic arch are all structurally normal, with no evidence of dilitation or obstruction. Venous: The inferior vena cava is normal in size with greater than 50% respiratory variability, suggesting right atrial pressure of 3 mmHg. IAS/Shunts: The atrial septum is grossly normal.  LEFT VENTRICLE PLAX 2D LVIDd:         3.21 cm  Diastology LVIDs:         2.66 cm  LV e' medial:    3.38 cm/s LV PW:         1.58 cm  LV E/e' medial:  19.7 LV IVS:        1.58 cm  LV e' lateral:   4.80 cm/s LVOT diam:     2.00 cm  LV E/e' lateral: 13.9 LVOT Area:     3.14 cm  RIGHT VENTRICLE RV S prime:     15.60 cm/s LEFT ATRIUM             Index       RIGHT ATRIUM           Index LA diam:        2.80 cm 1.38 cm/m  RA Area:     13.70 cm LA Vol (A2C):   56.6 ml 27.99 ml/m RA Volume:   33.70 ml  16.66 ml/m LA Vol (A4C):   59.2 ml 29.27 ml/m LA Biplane Vol: 58.8 ml 29.08 ml/m   AORTA Ao Root diam: 3.10 cm MITRAL VALVE MV Area (PHT): 3.72 cm    SHUNTS MV Decel Time: 204 msec    Systemic Diam: 2.00 cm MV E velocity: 66.70 cm/s MV A velocity: 90.30 cm/s MV E/A ratio:  0.74 Jodelle Red MD Electronically signed by Jodelle Red MD Signature Date/Time: 08/04/2020/1:58:11 PM    Final     Assessment and Plan:   1. Acute combined CHF: -presents with sudden onset of flash pulmonary edema, breathlessness and chest pressure, hypertensive emergency. - volume status much improved, but Cr bumped, off diuretics now - pt and family willing to make dietary changes >> need info - f/u on BMET today - volume ok on exam, continue to hold diuretics  2. NSTEMI - Ez elevated, ECG abnl  - this in the setting of hypertensive emergency, may be demand ischemia by ECG substantially abnormal - mult CRFs, previously undiagnosed DM/HLD, untreated BP - Cath discussed w/ pt and family -  BMET today pending, may have to wait  3. HTN - has been started on Coreg 6.25 mg bid, BP still too high - losartan d/c'd when Cr increased - will add Imdur, increase BB  4. Hyperlipidemia - has been started on Lipitor 40 mg qd  5. DM - new dx, A1c is elevated - needs info on diabetic diet - other rx per IM    Risk Assessment/Risk Scores:     TIMI Risk Score for Unstable Angina or Non-ST Elevation MI:   The patient's TIMI risk score is 4, which indicates a 20% risk of all cause mortality, new or recurrent myocardial infarction or need for urgent revascularization in the next 14 days.  New York Heart Association (NYHA) Functional Class NYHA Class I      For questions or updates, please contact [facility] Please consult [email or web address] for contact info under  SignedTheodore Demark, PA-C 08/06/2020 10:11 AM   Attending note:  Patient seen and examined.  I reviewed her records and discussed the case with Ms. Raford Pitcher PA-C as well as Dr. Kerry Hough.  Ms. Culverhouse presents with a longstanding history of hypertension, although untreated with medical therapy without regular healthcare follow-up since 2009.  She came in with sudden onset shortness of breath and chest pressure at rest, noted to have hypertensive emergency with flash pulmonary edema, minor increase in high-sensitivity troponin I to 346.  Responded favorably to diuresis and initiation of antihypertensive therapy.  Follow-up echocardiogram however demonstrates LVEF 40 to 45% with global hypokinesis.  Follow-up ECG in the absence of chest discomfort is significantly abnormal with anterolateral T wave inversions and ST segment changes concerning for Wellens T waves (although significant myocardial strain is also a possibility).  She also has type 2 diabetes mellitus based on hemoglobin A1c of 6.9%. She states that she has very strong religious faith, for this reason has felt comfortable not taking any medications or following with  a healthcare provider.  She is unvaccinated for COVID-19.  On examination this morning she appears comfortable at rest.  She is afebrile, heart rate in the 60s in sinus rhythm by telemetry which I personally reviewed.  Systolic blood pressure recently 130s to 160s.  Lungs are clear.  Cardiac exam reveals RRR without gallop.  She has no peripheral edema.  Pertinent lab work includes potassium 4.2, BUN 26, creatinine 1.66 down from 1.85, hemoglobin 11.7, platelets 198, LDL 156, TSH 3.39.  SARS coronavirus 2 test negative.  Follow-up chest x-ray on March 5 showed resolution of pulmonary edema.  Patient presents with hypertensive emergency associated with flash pulmonary edema, high-sensitivity troponin I levels a range most reflective of myocardial strain, but dynamic ECG changes concerning for anterior ischemia.  LVEF 40 to 45% with global hypokinesis. She has a longstanding history of untreated hypertension, recently diagnosed type 2 diabetes mellitus.  Renal insufficiency noted in the setting of diuresis, taken off ARB as well.  I discussed the situation in detail with the patient, explained the implications of her test results and concern for underlying CAD as precipitant of her presentation, although certainly untreated hypertension may also be the culprit.  We discussed the risks and benefits of diagnostic cardiac catheterization and she is in agreement to proceed.  I have explained to her that even if this procedure does not show obstructive CAD to require intervention, she will benefit from regular medical therapy and follow-up with a healthcare provider going forward.  Continue aspirin, Coreg, Lipitor, hold ARB with follow-up BMET in a.m.  Discussed with Dr. Kerry Hough who will arrange transfer to the hospitalist service at [facility].  Cardiology will continue to follow and can set up cardiac catheterization when creatinine stable.  Jonelle Sidle, M.D., F.A.C.C.

## 2020-08-06 NOTE — Progress Notes (Signed)
Nutrition Education Note   RD consulted for nutrition education regarding new diabetes diagnosis. Patient has a hx of HTN, CHF (EF 40-45%), HLD. She presents with acute respiratory failure with hypoxia, acute CHF-flash pulmonary edema, NSTEMI and AKI. She is to be transferred to [facility] for cardiac catherization. Two of her daughters at bedside and participated with her during education.  Lab Results  Component Value Date   HGBA1C 6.9 (H) 08/03/2020    RD provided "Diabetes Plate Method" handout. Discussed different food groups and their effects on blood sugar, emphasizing carbohydrate-containing foods. Provided list of carbohydrates and recommended serving sizes of common foods.  Discussed importance of controlled and consistent carbohydrate intake throughout the day. Provided examples of ways to balance meals/snacks and encouraged intake of high-fiber, whole grain complex carbohydrates. Teach back method used.  RD also discussed  "Low Sodium Nutrition Therapy" . Reviewed patient's dietary recall. Provided examples on ways to decrease sodium intake in diet. Discouraged intake of processed foods and use of salt shaker.  RD discussed why it is important for patient to adhere to diet recommendations, and emphasized the role of fluids, foods to avoid, and importance of weighing self daily. Teach back method used.  Expect good compliance. Patient has good support from her daughters.  Body mass index is 26.79 kg/m. Pt meets criteria for overweight based on current BMI.  Current diet order is CHO modified/2 gram Sodium diet, patient is consuming approximately 10% of meals at this time.  Labs and medications reviewed. No further nutrition interventions warranted at this time. RD contact information provided. If additional nutrition issues arise, please re-consult RD.   Royann Shivers MS,RD,CSG,LDN Pager: Loretha Stapler

## 2020-08-06 NOTE — Consult Note (Addendum)
Cardiology Consultation:   Patient ID: Brylinn C Dye MRN: [id number]; DOB: February 11, 1946  Admit date: 08/03/2020 Date of Consult: 08/06/2020  PCP:  Patient, No Pcp Per   [facility]  Consulting Cardiologist:  Nona Dell, MD (New)   Patient Profile:   DAESIA ZYLKA is a 75 y.o. female with a history of HTN (untreated, on no medications or with follow-up by physician since 2009), recently diagnosed type 2 diabetes mellitus with hemoglobin A1c 6.9% presenting now with hypertensive emergency associated with flash pulmonary edema.  Cardiology consultation requested by Dr Kerry Hough.  Prior to admission, patient states not taking medications because Jesus was protecting her. No Covid vaccine for same reason.  History of Present Illness:   Ms. Ciulla was admitted 03/05 with HTN emergency, peak blood pressure 248/157, and was in pulmonary edema. She was diuresed with IV Lasix. Her EF was reduced and her troponin elevated, cards asked to see.  Ms. Terhaar states that she never gets chest pain.  She lives alone and does things around the house without any difficulty.  She had not been having problems with lower extremity edema, orthopnea or PND.  On Saturday night, she ate dinner she admits probably had more salt than she should have, and sat down on the couch and realized something was wrong.  The shortness of breath was extremely sudden onset.  Her blood pressure improved with IV nitroglycerin with IV Lasix.  She has been transitioned to p.o. medications.  She has been diuresed, and her shortness of breath is improved.  Her creatinine increased so the diuretics and ARB have been discontinued.  She feels her breathing is back to baseline.  She feels well.  Her blood pressure is coming under better control.  She was not on any medications prior to admission greatest to take them.  She claims to be following her blood pressure at home and will do daily weights.  Her  daughter will help her with a low-sodium diet.   Past Medical History:  Diagnosis Date  . Hypertension     Past Surgical History:  Procedure Laterality Date  . No prior surgeries       Inpatient Medications: Scheduled Meds: . aspirin  325 mg Oral Daily  . atorvastatin  40 mg Oral q1800  . carvedilol  6.25 mg Oral BID WC  . Chlorhexidine Gluconate Cloth  6 each Topical Daily  . insulin aspart  0-15 Units Subcutaneous TID WC  . insulin aspart  0-5 Units Subcutaneous QHS   Continuous Infusions: . heparin 1,300 Units/hr (08/06/20 0959)   PRN Meds: acetaminophen **OR** acetaminophen, ondansetron **OR** ondansetron (ZOFRAN) IV, oxyCODONE, polyethylene glycol  Allergies:   No Known Allergies  Social History:   Social History   Tobacco Use  . Smoking status: Never Smoker  . Smokeless tobacco: Never Used  Substance Use Topics  . Alcohol use: Not Currently    Family History  Problem Relation Age of Onset  . Unexplained death Mother        Natural causes, died in her 73s  . Diabetes Father        Died in his 41s     ROS:  Please see the history of present illness.  All other ROS reviewed and negative.     Physical Exam/Data:   Vitals:   08/06/20 0700 08/06/20 0733 08/06/20 0800 08/06/20 0930  BP: 138/KALAYNA NOY4  Pulse:      Resp: 14  Temp:  98.8 F (37.1 C)    TempSrc:  Oral    SpO2:      Weight:      Height:        Intake/Output Summary (Last 24 hours) at 08/06/2020 1011 Last data filed at 08/06/2020 0959 Gross per 24 hour  Intake 1333.41 ml  Output 1150 ml  Net 183.41 ml   Last 3 Weights 08/06/2020 08/05/2020 08/03/2020  Weight (lbs) 171 lb 1.2 oz 171 lb 4.8 oz 200 lb  Weight (kg) 77.6 kg 77.7 kg 90.719 kg     Body mass index is 26.79 kg/m.  General:  Well nourished, well developed, in no acute distress HEENT: normal Lymph: no adenopathy Neck: minimal JVD Endocrine:  No thryomegaly Vascular: No carotid bruits; FA pulses 2+ bilaterally  without bruits  Cardiac:  normal S1, S2; RRR; no murmur Lungs: generally clear to auscultation bilaterally, no wheezing, rhonchi,  few rales bases Abd: soft, nontender, no hepatomegaly  Ext: no edema Musculoskeletal:  No deformities, BUE and BLE strength normal and equal Skin: warm and dry  Neuro:  CNs 2-12 intact, no focal abnormalities noted Psych:  Normal affect   EKG:  The EKG was personally reviewed and demonstrates: 03/05 ECG is SR, HR 84, lateral T wave inversions different from 03/04 ECG Telemetry:  Telemetry was personally reviewed and demonstrates:  SR   Laboratory Data:  High Sensitivity Troponin:   Recent Labs  Lab 08/03/20 2101 08/03/20 2251 08/04/20 0625 08/04/20 1119 08/04/20 1331  TROPONINIHS 15 64* 346* 304* 245*     Chemistry Recent Labs  Lab 08/04/20 0625 08/05/20 0452 08/06/20 0842  NA 140 140 138  K 4.6 4.3 4.2  CL 105 105 104  CO2 24 24 25   GLUCOSE 150* 138* 109*  BUN 18 24* 26*  CREATININE 1.30* 1.85* 1.66*  CALCIUM 9.0 8.8* 8.6*  GFRNONAA 43* 28* 32*  ANIONGAP 11 11 9     Recent Labs  Lab 08/03/20 2101 08/04/20 0625  PROT 8.4* 7.5  ALBUMIN 4.3 3.8  AST 28 27  ALT 16 16  ALKPHOS 86 73  BILITOT 0.3 0.3   Hematology Recent Labs  Lab 08/04/20 1119 08/05/20 0452 08/06/20 0517  WBC 11.8* 11.0* 9.5  RBC 4.67 4.17 4.23  HGB 12.8 11.6* 11.7*  HCT 40.2 35.1* 35.6*  MCV 86.1 84.2 84.2  MCH 27.4 27.8 27.7  MCHC 31.8 33.0 32.9  RDW 14.4 14.4 14.4  PLT 224 191 198   BNP Recent Labs  Lab 08/03/20 2052  BNP 278.0*     Lab Results  Component Value Date   TSH 3.396 08/03/2020   Lab Results  Component Value Date   HGBA1C 6.9 (H) 08/03/2020   Lab Results  Component Value Date   CHOL 266 (H) 08/04/2020   HDL 70 08/04/2020   LDLCALC 156 (H) 08/04/2020   TRIG 198 (H) 08/04/2020   CHOLHDL 3.8 08/04/2020     Radiology/Studies:  DG Chest Port 1 View  Result Date: 08/04/2020 CLINICAL DATA:  CHF EXAM: PORTABLE CHEST 1 VIEW  COMPARISON:  Yesterday FINDINGS: Resolved pulmonary edema. Stable generous heart size with aortic tortuosity. No visible effusion or pneumothorax. IMPRESSION: Resolved pulmonary edema. Electronically Signed   By: Marnee SpringJonathon  Watts M.D.   On: 08/04/2020 05:56   DG Chest Port 1 View  Result Date: 08/03/2020 CLINICAL DATA:  Shortness of breath, respiratory distress EXAM: PORTABLE CHEST 1 VIEW COMPARISON:  None. FINDINGS: Diffuse hazy perihilar and basilar predominant opacities in both lungs  with indistinct pulmonary vascularity. No pneumothorax or visible effusion. Enlarged cardiomediastinal silhouette. No acute osseous or soft tissue abnormality. Degenerative changes are present in the imaged spine and shoulders. Telemetry leads overlie the chest. IMPRESSION: Cardiomegaly with diffuse hazy perihilar and basilar predominant opacities, most likely edema/CHF. Infection not fully excluded. Electronically Signed   By: Kreg Shropshire M.D.   On: 08/03/2020 21:04   ECHOCARDIOGRAM COMPLETE  Result Date: 08/04/2020    ECHOCARDIOGRAM REPORT   Patient Name:   ADINE HEIMANN Date of Exam: 08/04/2020 Medical Rec #:  [id number]         Height:       67.0 in Accession #:    [id number]        Weight:       200.0 lb Date of Birth:  May 10, 1946        BSA:          2.022 m Patient Age:    74 years          BP:           192/104 mmHg Patient Gender: F                 HR:           85 bpm. [facility] Procedure: 2D Echo Indications:    Congestive Heart Failure I50.9  History:        Patient has no prior history of Echocardiogram examinations.                 CHF; Risk Factors:Hypertension and Non-Smoker. Acute Respiratory                 Failure.  Sonographer:    Jeryl Columbia RDCS (AE) Referring Phys: [id number] ASIA B ZIERLE-GHOSH IMPRESSIONS  1. Left ventricular ejection fraction, by estimation, is 40 to 45%. The left ventricle has mildly decreased function. The left ventricle demonstrates global hypokinesis. There is  moderate concentric left ventricular hypertrophy. Left ventricular diastolic parameters are indeterminate.  2. Right ventricular systolic function is normal. The right ventricular size is normal.  3. Left atrial size was mildly dilated.  4. The mitral valve is grossly normal. Trivial mitral valve regurgitation. Moderate to severe mitral annular calcification.  5. The aortic valve is tricuspid. Aortic valve regurgitation is not visualized. No aortic stenosis is present.  6. The inferior vena cava is normal in size with greater than 50% respiratory variability, suggesting right atrial pressure of 3 mmHg. Comparison(s): No prior Echocardiogram. Conclusion(s)/Recommendation(s): EF 40-45% with global hypokinesis, moderate LVH. No significant valve disease. FINDINGS  Left Ventricle: Left ventricular ejection fraction, by estimation, is 40 to 45%. The left ventricle has mildly decreased function. The left ventricle demonstrates global hypokinesis. The left ventricular internal cavity size was normal in size. There is  moderate concentric left ventricular hypertrophy. Left ventricular diastolic parameters are indeterminate. Right Ventricle: The right ventricular size is normal. No increase in right ventricular wall thickness. Right ventricular systolic function is normal. Left Atrium: Left atrial size was mildly dilated. Right Atrium: Right atrial size was normal in size. Pericardium: There is no evidence of pericardial effusion. Mitral Valve: The mitral valve is grossly normal. There is mild thickening of the mitral valve leaflet(s). There is mild calcification of the mitral valve leaflet(s). Moderate to severe mitral annular calcification. Trivial mitral valve regurgitation. Tricuspid Valve: The tricuspid valve is normal in structure. Tricuspid valve regurgitation is trivial. No evidence of tricuspid stenosis. Aortic Valve: The  aortic valve is tricuspid. Aortic valve regurgitation is not visualized. No aortic stenosis is  present. Pulmonic Valve: The pulmonic valve was grossly normal. Pulmonic valve regurgitation is not visualized. Aorta: The aortic root, ascending aorta and aortic arch are all structurally normal, with no evidence of dilitation or obstruction. Venous: The inferior vena cava is normal in size with greater than 50% respiratory variability, suggesting right atrial pressure of 3 mmHg. IAS/Shunts: The atrial septum is grossly normal.  LEFT VENTRICLE PLAX 2D LVIDd:         3.21 cm  Diastology LVIDs:         2.66 cm  LV e' medial:    3.38 cm/s LV PW:         1.58 cm  LV E/e' medial:  19.7 LV IVS:        1.58 cm  LV e' lateral:   4.80 cm/s LVOT diam:     2.00 cm  LV E/e' lateral: 13.9 LVOT Area:     3.14 cm  RIGHT VENTRICLE RV S prime:     15.60 cm/s LEFT ATRIUM             Index       RIGHT ATRIUM           Index LA diam:        2.80 cm 1.38 cm/m  RA Area:     13.70 cm LA Vol (A2C):   56.6 ml 27.99 ml/m RA Volume:   33.70 ml  16.66 ml/m LA Vol (A4C):   59.2 ml 29.27 ml/m LA Biplane Vol: 58.8 ml 29.08 ml/m   AORTA Ao Root diam: 3.10 cm MITRAL VALVE MV Area (PHT): 3.72 cm    SHUNTS MV Decel Time: 204 msec    Systemic Diam: 2.00 cm MV E velocity: 66.70 cm/s MV A velocity: 90.30 cm/s MV E/A ratio:  0.74 Jodelle Red MD Electronically signed by Jodelle Red MD Signature Date/Time: 08/04/2020/1:58:11 PM    Final     Assessment and Plan:   1. Acute combined CHF: -presents with sudden onset of flash pulmonary edema, breathlessness and chest pressure, hypertensive emergency. - volume status much improved, but Cr bumped, off diuretics now - pt and family willing to make dietary changes >> need info - f/u on BMET today - volume ok on exam, continue to hold diuretics  2. NSTEMI - Ez elevated, ECG abnl  - this in the setting of hypertensive emergency, may be demand ischemia by ECG substantially abnormal - mult CRFs, previously undiagnosed DM/HLD, untreated BP - Cath discussed w/ pt and family -  BMET today pending, may have to wait  3. HTN - has been started on Coreg 6.25 mg bid, BP still too high - losartan d/c'd when Cr increased - will add Imdur, increase BB  4. Hyperlipidemia - has been started on Lipitor 40 mg qd  5. DM - new dx, A1c is elevated - needs info on diabetic diet - other rx per IM    Risk Assessment/Risk Scores:     TIMI Risk Score for Unstable Angina or Non-ST Elevation MI:   The patient's TIMI risk score is 4, which indicates a 20% risk of all cause mortality, new or recurrent myocardial infarction or need for urgent revascularization in the next 14 days.  New York Heart Association (NYHA) Functional Class NYHA Class I      For questions or updates, please contact [facility] Please consult [email or web address] for contact info under  SignedTheodore Demark, PA-C 08/06/2020 10:11 AM   Attending note:  Patient seen and examined.  I reviewed her records and discussed the case with Ms. Raford Pitcher PA-C as well as Dr. Kerry Hough.  Ms. Culverhouse presents with a longstanding history of hypertension, although untreated with medical therapy without regular healthcare follow-up since 2009.  She came in with sudden onset shortness of breath and chest pressure at rest, noted to have hypertensive emergency with flash pulmonary edema, minor increase in high-sensitivity troponin I to 346.  Responded favorably to diuresis and initiation of antihypertensive therapy.  Follow-up echocardiogram however demonstrates LVEF 40 to 45% with global hypokinesis.  Follow-up ECG in the absence of chest discomfort is significantly abnormal with anterolateral T wave inversions and ST segment changes concerning for Wellens T waves (although significant myocardial strain is also a possibility).  She also has type 2 diabetes mellitus based on hemoglobin A1c of 6.9%. She states that she has very strong religious faith, for this reason has felt comfortable not taking any medications or following with  a healthcare provider.  She is unvaccinated for COVID-19.  On examination this morning she appears comfortable at rest.  She is afebrile, heart rate in the 60s in sinus rhythm by telemetry which I personally reviewed.  Systolic blood pressure recently 130s to 160s.  Lungs are clear.  Cardiac exam reveals RRR without gallop.  She has no peripheral edema.  Pertinent lab work includes potassium 4.2, BUN 26, creatinine 1.66 down from 1.85, hemoglobin 11.7, platelets 198, LDL 156, TSH 3.39.  SARS coronavirus 2 test negative.  Follow-up chest x-ray on March 5 showed resolution of pulmonary edema.  Patient presents with hypertensive emergency associated with flash pulmonary edema, high-sensitivity troponin I levels a range most reflective of myocardial strain, but dynamic ECG changes concerning for anterior ischemia.  LVEF 40 to 45% with global hypokinesis. She has a longstanding history of untreated hypertension, recently diagnosed type 2 diabetes mellitus.  Renal insufficiency noted in the setting of diuresis, taken off ARB as well.  I discussed the situation in detail with the patient, explained the implications of her test results and concern for underlying CAD as precipitant of her presentation, although certainly untreated hypertension may also be the culprit.  We discussed the risks and benefits of diagnostic cardiac catheterization and she is in agreement to proceed.  I have explained to her that even if this procedure does not show obstructive CAD to require intervention, she will benefit from regular medical therapy and follow-up with a healthcare provider going forward.  Continue aspirin, Coreg, Lipitor, hold ARB with follow-up BMET in a.m.  Discussed with Dr. Kerry Hough who will arrange transfer to the hospitalist service at [facility].  Cardiology will continue to follow and can set up cardiac catheterization when creatinine stable.  Jonelle Sidle, M.D., F.A.C.C.

## 2020-08-07 ENCOUNTER — Encounter (HOSPITAL_COMMUNITY): Payer: Self-pay | Admitting: Interventional Cardiology

## 2020-08-07 ENCOUNTER — Encounter (HOSPITAL_COMMUNITY)
Admission: EM | Disposition: A | Discharge status: Home / Self Care | Payer: Self-pay | Source: Home / Self Care | Attending: Internal Medicine

## 2020-08-07 DIAGNOSIS — I214 Non-ST elevation (NSTEMI) myocardial infarction: Principal | ICD-10-CM

## 2020-08-07 DIAGNOSIS — I2510 Atherosclerotic heart disease of native coronary artery without angina pectoris: Secondary | ICD-10-CM

## 2020-08-07 HISTORY — PX: LEFT HEART CATH AND CORONARY ANGIOGRAPHY: CATH118249

## 2020-08-07 HISTORY — PX: INTRAVASCULAR ULTRASOUND/IVUS: CATH118244

## 2020-08-07 HISTORY — PX: CORONARY STENT INTERVENTION: CATH118234

## 2020-08-07 LAB — CBC
HCT: 34.8 % — ABNORMAL LOW (ref 36.0–46.0)
Hemoglobin: 11.6 g/dL — ABNORMAL LOW (ref 12.0–15.0)
MCH: 27.4 pg (ref 26.0–34.0)
MCHC: 33.3 g/dL (ref 30.0–36.0)
MCV: 82.3 fL (ref 80.0–100.0)
Platelets: 192 10*3/uL (ref 150–400)
RBC: 4.23 MIL/uL (ref 3.87–5.11)
RDW: 14.1 % (ref 11.5–15.5)
WBC: 8.8 10*3/uL (ref 4.0–10.5)
nRBC: 0 % (ref 0.0–0.2)

## 2020-08-07 LAB — BASIC METABOLIC PANEL
Anion gap: 9 (ref 5–15)
BUN: 24 mg/dL — ABNORMAL HIGH (ref 8–23)
CO2: 25 mmol/L (ref 22–32)
Calcium: 8.9 mg/dL (ref 8.9–10.3)
Chloride: 104 mmol/L (ref 98–111)
Creatinine, Ser: 1.53 mg/dL — ABNORMAL HIGH (ref 0.44–1.00)
GFR, Estimated: 36 mL/min — ABNORMAL LOW (ref >60)
Glucose, Bld: 117 mg/dL — ABNORMAL HIGH (ref 70–99)
Potassium: 4.1 mmol/L (ref 3.5–5.1)
Sodium: 138 mmol/L (ref 135–145)

## 2020-08-07 LAB — GLUCOSE, CAPILLARY
Glucose-Capillary: 124 mg/dL — ABNORMAL HIGH (ref 70–99)
Glucose-Capillary: 137 mg/dL — ABNORMAL HIGH (ref 70–99)
Glucose-Capillary: 93 mg/dL (ref 70–99)
Glucose-Capillary: 104 mg/dL — ABNORMAL HIGH (ref 70–99)

## 2020-08-07 LAB — POCT ACTIVATED CLOTTING TIME
Activated Clotting Time: 315 seconds
Activated Clotting Time: 327 seconds

## 2020-08-07 LAB — HEPARIN LEVEL (UNFRACTIONATED): Heparin Unfractionated: 0.31 IU/mL (ref 0.30–0.70)

## 2020-08-07 SURGERY — LEFT HEART CATH AND CORONARY ANGIOGRAPHY
Anesthesia: LOCAL

## 2020-08-07 MED ORDER — HEPARIN (PORCINE) IN NACL 1000-0.9 UT/500ML-% IV SOLN
INTRAVENOUS | Status: AC
  Filled 2020-08-07: qty 1000

## 2020-08-07 MED ORDER — IOHEXOL 350 MG/ML SOLN
INTRAVENOUS | Status: DC | PRN
  Administered 2020-08-07: 130 mL via INTRA_ARTERIAL

## 2020-08-07 MED ORDER — FENTANYL CITRATE (PF) 100 MCG/2ML IJ SOLN
INTRAMUSCULAR | Status: DC | PRN
  Administered 2020-08-07: 25 ug via INTRAVENOUS

## 2020-08-07 MED ORDER — HYDRALAZINE HCL 20 MG/ML IJ SOLN: 10.0000 mg | Freq: Four times a day (QID) | INTRAMUSCULAR | Status: DC | PRN

## 2020-08-07 MED ORDER — FENTANYL CITRATE (PF) 100 MCG/2ML IJ SOLN
INTRAMUSCULAR | Status: AC
  Filled 2020-08-07: qty 2

## 2020-08-07 MED ORDER — MIDAZOLAM HCL 2 MG/2ML IJ SOLN
INTRAMUSCULAR | Status: AC
  Filled 2020-08-07: qty 2

## 2020-08-07 MED ORDER — HEPARIN SODIUM (PORCINE) 1000 UNIT/ML IJ SOLN
INTRAMUSCULAR | Status: AC
  Filled 2020-08-07: qty 1

## 2020-08-07 MED ORDER — LIDOCAINE HCL (PF) 1 % IJ SOLN
INTRAMUSCULAR | Status: DC | PRN
  Administered 2020-08-07: 2 mL

## 2020-08-07 MED ORDER — ACETAMINOPHEN 325 MG PO TABS: 650.0000 mg | ORAL_TABLET | ORAL | Status: DC | PRN

## 2020-08-07 MED ORDER — LIDOCAINE HCL (PF) 1 % IJ SOLN
INTRAMUSCULAR | Status: AC
  Filled 2020-08-07: qty 30

## 2020-08-07 MED ORDER — LABETALOL HCL 5 MG/ML IV SOLN: 10.0000 mg | INTRAVENOUS | Status: AC | PRN

## 2020-08-07 MED ORDER — TICAGRELOR 90 MG PO TABS
90.0000 mg | ORAL_TABLET | Freq: Two times a day (BID) | ORAL | Status: DC
  Administered 2020-08-07 – 2020-08-09 (×4): 90 mg via ORAL
  Filled 2020-08-07 (×4): qty 1

## 2020-08-07 MED ORDER — SODIUM CHLORIDE 0.9 % IV SOLN: INTRAVENOUS | Status: AC

## 2020-08-07 MED ORDER — SODIUM CHLORIDE 0.9% FLUSH: 3.0000 mL | INTRAVENOUS | Status: DC | PRN

## 2020-08-07 MED ORDER — TICAGRELOR 90 MG PO TABS
ORAL_TABLET | ORAL | Status: AC
  Filled 2020-08-07: qty 2

## 2020-08-07 MED ORDER — TICAGRELOR 90 MG PO TABS
ORAL_TABLET | ORAL | Status: DC | PRN
  Administered 2020-08-07: 180 mg via ORAL

## 2020-08-07 MED ORDER — HYDRALAZINE HCL 20 MG/ML IJ SOLN: 10.0000 mg | INTRAMUSCULAR | Status: AC | PRN

## 2020-08-07 MED ORDER — ONDANSETRON HCL 4 MG/2ML IJ SOLN
4.0000 mg | Freq: Four times a day (QID) | INTRAMUSCULAR | Status: DC | PRN
  Filled 2020-08-07: qty 2

## 2020-08-07 MED ORDER — HEPARIN (PORCINE) IN NACL 1000-0.9 UT/500ML-% IV SOLN
INTRAVENOUS | Status: DC | PRN
  Administered 2020-08-07 (×2): 500 mL

## 2020-08-07 MED ORDER — HEPARIN SODIUM (PORCINE) 1000 UNIT/ML IJ SOLN
INTRAMUSCULAR | Status: DC | PRN
  Administered 2020-08-07: 6000 [IU] via INTRAVENOUS
  Administered 2020-08-07: 4000 [IU] via INTRAVENOUS

## 2020-08-07 MED ORDER — ASPIRIN 81 MG PO CHEW
81.0000 mg | CHEWABLE_TABLET | Freq: Every day | ORAL | Status: DC
  Administered 2020-08-08 – 2020-08-09 (×2): 81 mg via ORAL
  Filled 2020-08-07 (×2): qty 1

## 2020-08-07 MED ORDER — MIDAZOLAM HCL 2 MG/2ML IJ SOLN
INTRAMUSCULAR | Status: DC | PRN
  Administered 2020-08-07: 2 mg via INTRAVENOUS

## 2020-08-07 MED ORDER — VERAPAMIL HCL 2.5 MG/ML IV SOLN
INTRAVENOUS | Status: DC | PRN
  Administered 2020-08-07 (×2): 10 mL via INTRA_ARTERIAL

## 2020-08-07 MED ORDER — SODIUM CHLORIDE 0.9% FLUSH
3.0000 mL | Freq: Two times a day (BID) | INTRAVENOUS | Status: DC
  Administered 2020-08-07 – 2020-08-08 (×4): 3 mL via INTRAVENOUS

## 2020-08-07 MED ORDER — VERAPAMIL HCL 2.5 MG/ML IV SOLN
INTRAVENOUS | Status: AC
  Filled 2020-08-07: qty 2

## 2020-08-07 MED ORDER — SODIUM CHLORIDE 0.9 % IV SOLN: 250.0000 mL | INTRAVENOUS | Status: DC | PRN

## 2020-08-07 SURGICAL SUPPLY — 20 items
BALLN ~~LOC~~ SAPPHIRE 4.5X8 (BALLOONS) ×2
BALLOON ~~LOC~~ SAPPHIRE 4.5X8 (BALLOONS) ×1 IMPLANT
CATH 5FR JL3.5 JR4 ANG PIG MP (CATHETERS) ×2 IMPLANT
CATH LAUNCHER 6FR EBU3.5 (CATHETERS) ×2 IMPLANT
CATH OPTICROSS HD (CATHETERS) ×2 IMPLANT
COVER SWIFTLINK CONNECTOR (BAG) ×2 IMPLANT
DEVICE RAD COMP TR BAND LRG (VASCULAR PRODUCTS) ×2 IMPLANT
GLIDESHEATH SLEND SS 6F .021 (SHEATH) ×2 IMPLANT
GUIDEWIRE INQWIRE 1.5J.035X260 (WIRE) ×1 IMPLANT
INQWIRE 1.5J .035X260CM (WIRE) ×2
KIT ENCORE 26 ADVANTAGE (KITS) ×2 IMPLANT
KIT HEART LEFT (KITS) ×2 IMPLANT
KIT HEMO VALVE WATCHDOG (MISCELLANEOUS) ×2 IMPLANT
PACK CARDIAC CATHETERIZATION (CUSTOM PROCEDURE TRAY) ×2 IMPLANT
SLED PULL BACK IVUS (MISCELLANEOUS) ×2 IMPLANT
STENT RESOLUTE ONYX 4.0X15 (Permanent Stent) ×2 IMPLANT
TRANSDUCER W/STOPCOCK (MISCELLANEOUS) ×2 IMPLANT
TUBING CIL FLEX 10 FLL-RA (TUBING) ×2 IMPLANT
WIRE ASAHI FIELDER XT 190CM (WIRE) ×4 IMPLANT
WIRE ASAHI PROWATER 180CM (WIRE) ×2 IMPLANT

## 2020-08-07 NOTE — Progress Notes (Signed)
PROGRESS NOTE   Cassandra Johnson  XAJ:[id number] DOB: Feb 06, 1946 DOA: 08/03/2020 PCP: Patient, No Pcp Per  Brief Narrative:  75 year old white female community dwelling Untreated HTN,  dietary indiscretions Prior hospitalization 2009 shortness of breath volume overload Presented to [facility] 3/5 short of breath, cough (Covid negative) Evaluation revealed hypoxic respiratory failure CXR = CHF, EKG without ischemic changes  Cardiology consulted-patient started on diuresis In the interim also found to have on follow-up EKG anterolateral T wave inversions ST-T wave changes and a new diagnosis of DM TY 2 Patient was transferred to [facility] 3/7 for consideration of cardiac cath  Hospital-Problem based course  Unstable angina status post stent 08/07/2020 as below Hypertensive urgency HF R EF acute  no chest pain on presentation-blood troponins peaked at 346  Care per cardiology-GDMT Coreg 12.5 twice daily Imdur 30  atorvastatin 40  Aspirin 81 and Brilinta 90 twice daily as per cardiology  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  Will need UA to check for proteinuria  Eventually may need ACE inhibitor DM TY 2 new onset A1c 6.9  Nursing to teach how to use glucometer  Will start likely glipizide on discharge and outpatient titration  DVT prophylaxis: Lovenox Code Status: Full Family Communication: Discussed with both daughters at the bedside 3/8 and they understand completely-gave opportunities for questions Disposition:  Status is: Inpatient  Remains inpatient appropriate because:Hemodynamically unstable, Ongoing active pain requiring inpatient pain management and IV treatments appropriate due to intensity of illness or inability to take PO   Dispo: The patient is from: Home              Anticipated d/c is to: Home              Patient currently is not medically stable to d/c.   Difficult to place patient No       Consultants:    Cardiologist  Procedures: Cardiac cath as below  Antimicrobials: None   Subjective: Awake alert coherent no distress Eating drinking Has TR band on right hand No chest pain No fever   Objective: Vitals:   08/07/20 1100 08/07/20 1115 08/07/20 1130 08/07/20 1200  BP: 131/72 120/77 122/86 (!) 135/115  Pulse: (!) 51 (!) 52 (!) 52 (!) 55  Resp: (!) 4 (!) 0 (!) 0 15  Temp:      TempSrc:      SpO2: 92% 95% 95% 95%  Weight:      Height:        Intake/Output Summary (Last 24 hours) at 08/07/2020 1339 Last data filed at 08/07/2020 0743 Gross per 24 hour  Intake 262.81 ml  Output 200 ml  Net 62.81 ml   Filed Weights   08/06/20 0500 08/06/20 1950 08/07/20 0509  Weight: 77.6 kg 77.6 kg 76.6 kg    Examination:  Awake alert coherent EOMI NCAT no focal deficit CTA B no added sound No rales or rhonchi Abdomen soft--No rebound/guarding Neurologically intact no focal deficit ROM intact Psych euthymic pleasant  Data Reviewed: personally reviewed  Data reviewed Echo EF 40-45% concentric LV hypertrophy BUNs/creatinine 24/1.8-->24/1.5 WBC 8.8, hemoglobin 11.6 platelet 192  Cardiac Cath 3/8  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 This was a small vessel.  Ost LAD to Prox LAD lesion is 70% stenosed. A drug-eluting stent was successfully placed using a STENT RESOLUTE ONYX 4.0X15 and optimized with intravascular ultrasound.  Mid LAD lesion is 25% stenosed.  Dist LAD lesion is 10% stenosed.  Post intervention,  there is a 0% residual stenosis.  LV end diastolic pressure is normal. LVEDP 8 mm Hg.  There is no aortic valve stenosis.  A drug-eluting stent was successfully placed using a STENT RESOLUTE ONYX 4.0X15.   Due to severe tortuosity at the proximal LAD, it was difficult to cross into the mid LAD.  Super cross catheter would probably be needed in the future if access to the LAD was required.  CBC    Component Value Date/Time   WBC 8.8 08/07/2020 0055   RBC 4.23 08/07/2020  0055   HGB 11.6 (L) 08/07/2020 0055   HCT 34.8 (L) 08/07/2020 0055   PLT 192 08/07/2020 0055   MCV 82.3 08/07/2020 0055   MCH 27.4 08/07/2020 0055   MCHC 33.3 08/07/2020 0055   RDW 14.1 08/07/2020 0055   LYMPHSABS 6.1 (H) 08/03/2020 2101   MONOABS 0.7 08/03/2020 2101   EOSABS 0.3 08/03/2020 2101   BASOSABS 0.1 08/03/2020 2101   CMP Latest Ref Rng & Units 08/07/2020 08/06/2020 08/05/2020  Glucose 70 - 99 mg/dL 893(Y) 101(B) 510(C)  BUN 8 - 23 mg/dL 58(N) 27(P) 82(U)  Creatinine 0.44 - 1.00 mg/dL 2.35(T) 6.14(E) 3.15(Q)  Sodium 135 - 145 mmol/L 138 138 140  Potassium 3.5 - 5.1 mmol/L 4.1 4.2 4.3  Chloride 98 - 111 mmol/L 104 104 105  CO2 22 - 32 mmol/L 25 25 24   Calcium 8.9 - 10.3 mg/dL 8.9 ) 0.0(Q)  Total Protein 6.5 - 8.1 g/dL - - -  Total Bilirubin 0.3 - 1.2 mg/dL - - -  Alkaline Phos 38 - 126 U/L - - -  AST 15 - 41 U/L - - -  ALT 0 - 44 U/L - - -     Radiology Studies: CARDIAC CATHETERIZATION  Result Date: 08/07/2020  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 This was a small vessel.  Ost LAD to Prox LAD lesion is 70% stenosed. A drug-eluting stent was successfully placed using a STENT RESOLUTE ONYX 4.0X15 and optimized with intravascular ultrasound.  Mid LAD lesion is 25% stenosed.  Dist LAD lesion is 10% stenosed.  Post intervention, there is a 0% residual stenosis.  LV end diastolic pressure is normal. LVEDP 8 mm Hg.  There is no aortic valve stenosis.  A drug-eluting stent was successfully placed using a STENT RESOLUTE ONYX 4.0X15.  Due to severe tortuosity at the proximal LAD, it was difficult to cross into the mid LAD.  Super cross catheter would probably be needed in the future if access to the LAD was required. She also had some tortuosity in the right subclavian.  The right radial pulse was faint although by ultrasound, it appeared the right radial was patent.  The ulnar artery appeared to be larger which is why we chose this approach.  There was some vasospasm in the ulnar which  was treated with additional verapamil at the end of the case.     Scheduled Meds: . [START ON 08/08/2020] aspirin  81 mg Oral Daily  . atorvastatin  40 mg Oral q1800  . carvedilol  12.5 mg Oral BID WC  . Chlorhexidine Gluconate Cloth  6 each Topical Daily  . insulin aspart  0-15 Units Subcutaneous TID WC  . insulin aspart  0-5 Units Subcutaneous QHS  . isosorbide mononitrate  30 mg Oral Daily  . sodium chloride flush  3 mL Intravenous Q12H  . ticagrelor  90 mg Oral BID   Continuous Infusions: . sodium chloride    . sodium chloride  LOS: 4 days   Time spent: 46  Rhetta Mura, MD [facility] To contact the attending provider between 7A-7P or the covering provider during after hours 7P-7A, please log into the web site [email or web address] and access using universal [place] password for that web site. If you do not have the password, please call the hospital operator.  08/07/2020, 1:39 PM

## 2020-08-07 NOTE — Progress Notes (Signed)
ANTICOAGULATION CONSULT NOTE -   Pharmacy Consult for Heparin Indication: chest pain/ACS  No Known Allergies  Patient Measurements: Height: 5\' 7"  (170.2 cm) Weight: 76.6 kg (168 lb 14 oz) IBW/kg (Calculated) : 61.6 HEPARIN DW (KG): 77.2  Vital Signs: Temp: 99.1 F (37.3 C) (03/08 0509) Temp Source: Oral (03/08 0509) BP: 126/79 (03/08 0509) Pulse Rate: 62 (03/08 0509)  Labs: Recent Labs    08/04/20 1119 08/04/20 1331 08/04/20 1940 08/05/20 0452 08/05/20 1250 08/06/20 0517 08/06/20 0842 08/07/20 0055  HGB 12.8  --   --  11.6*  --  11.7*  --  11.6*  HCT 40.2  --   --  35.1*  --  35.6*  --  34.8*  PLT 224  --   --  191  --  198  --  192  HEPARINUNFRC  --   --    < > 0.62 0.60 0.46  --  0.31  CREATININE  --   --   --  1.85*  --   --  1.66* 1.53*  TROPONINIHS 304* 245*  --   --   --   --   --   --    < > = values in this interval not displayed.    Estimated Creatinine Clearance: 34.9 mL/min (A) (by C-G formula based on SCr of 1.53 mg/dL (H)).   Medical History: Past Medical History:  Diagnosis Date  . Hypertension     Medications:  No medications prior to admission.    Assessment: 58 yoF admitted with SOB and chest pressure. Heparin started for ACS r/o. Heparin level therapeutic at 0.31, CBC stable. No AC noted PTA.  Goal of Therapy:  Heparin level 0.3-0.7 units/ml Monitor platelets by anticoagulation protocol: Yes   Plan:  Increase heparin to 1350 units/h to keep therapeutic Daily heparin level and CBC Cath lab today   66, PharmD, BCPS, [facility] Clinical Pharmacist [phone] Please check AMION for all [facility] numbers 08/07/2020

## 2020-08-07 NOTE — Interval H&P Note (Signed)
Cath Lab Visit (complete for each Cath Lab visit)  Clinical Evaluation Leading to the Procedure:   ACS: Yes.    Non-ACS:    Anginal Classification: CCS IV  Anti-ischemic medical therapy: Minimal Therapy (1 class of medications)  Non-Invasive Test Results: No non-invasive testing performed  Prior CABG: No previous CABG   Minimize contrast due to renal insufficiency   History and Physical Interval Note:  08/07/2020 8:22 AM  Cassandra Johnson  has presented today for surgery, with the diagnosis of chest pain.  The various methods of treatment have been discussed with the patient and family. After consideration of risks, benefits and other options for treatment, the patient has consented to  Procedure(s): LEFT HEART CATH AND CORONARY ANGIOGRAPHY (N/A) as a surgical intervention.  The patient's history has been reviewed, patient examined, no change in status, stable for surgery.  I have reviewed the patient's chart and labs.  Questions were answered to the patient's satisfaction.     Lance Muss

## 2020-08-07 NOTE — Hospital Course (Addendum)
75 year old white female community dwelling Untreated HTN,  dietary indiscretions Prior hospitalization 2009 shortness of breath volume overload Presented to [facility] 3/5 short of breath, cough (Covid negative) Evaluation revealed hypoxic respiratory failure CXR = CHF, EKG without ischemic changes  Cardiology consulted-patient started on diuresis In the interim also found to have on follow-up EKG anterolateral T wave inversions ST-T wave changes and a new diagnosis of DM TY 2 Patient was transferred to [facility] 3/7 for consideration of cardiac cath  Data reviewed Echo EF 40-45% concentric LV hypertrophy BUNs/creatinine 24/1.8-->24/1.5 WBC 8.8, hemoglobin 11.6 platelet 192   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 This was a small vessel. Ost LAD to Prox LAD lesion is 70% stenosed. A drug-eluting stent was successfully placed using a STENT RESOLUTE ONYX 4.0X15 and optimized with intravascular ultrasound. Mid LAD lesion is 25% stenosed. Dist LAD lesion is 10% stenosed. Post intervention, there is a 0% residual stenosis. LV end diastolic pressure is normal. LVEDP 8 mm Hg. There is no aortic valve stenosis. A drug-eluting stent was successfully placed using a STENT RESOLUTE ONYX 4.0X15.   Due to severe tortuosity at the proximal LAD, it was difficult to cross into the mid LAD.  Super cross catheter would probably be needed in the future if access to the LAD was required.

## 2020-08-07 NOTE — Progress Notes (Signed)
Progress Note  Patient Name: Cassandra Johnson Date of Encounter: 08/07/2020  [facility] Cardiologist: Nona Dell, MD   Subjective   Feeling better less short of breath.  Renal function slightly improving.  T wave inversions noted on ECG compatible with LAD ischemia.  Transferred from [facility] for heart catheterization. Currently in catheterization lab.  Visited with Dr. Eldridge Dace and patient.  Proximal LAD lesion noted.  Currently proceeding with IVUS and likely direct stent.  Inpatient Medications    Scheduled Meds: . [START ON 08/08/2020] aspirin EC  81 mg Oral Daily  . atorvastatin  40 mg Oral q1800  . carvedilol  12.5 mg Oral BID WC  . Chlorhexidine Gluconate Cloth  6 each Topical Daily  . insulin aspart  0-15 Units Subcutaneous TID WC  . insulin aspart  0-5 Units Subcutaneous QHS  . isosorbide mononitrate  30 mg Oral Daily  . sodium chloride flush  3 mL Intravenous Q12H   Continuous Infusions: . sodium chloride    . sodium chloride 50 mL/hr at 08/07/20 0508  . heparin 1,350 Units/hr (08/07/20 0743)   PRN Meds: sodium chloride, acetaminophen **OR** acetaminophen, ondansetron **OR** ondansetron (ZOFRAN) IV, oxyCODONE, polyethylene glycol, sodium chloride flush   Vital Signs    Vitals:   08/06/20 2341 08/07/20 0400 08/07/20 0509 08/07/20 0742  BP: (!) 148/83 127/77 126/79 113/74  Pulse: 62 65 62 (!) 58  Resp: 15 16 13 14   Temp: 99.1 F (37.3 C)  99.1 F (37.3 C) 98.4 F (36.9 C)  TempSrc: Oral  Oral Oral  SpO2: 95% 97% 98% 96%  Weight:   76.6 kg   Height:        Intake/Output Summary (Last 24 hours) at 08/07/2020 0803 Last data filed at 08/07/2020 0743 Gross per 24 hour  Intake 321.55 ml  Output 200 ml  Net 121.55 ml   Last 3 Weights 08/07/2020 08/06/2020 08/06/2020  Weight (lbs) 168 lb 14 oz 171 lb 1.2 oz 171 lb 1.2 oz  Weight (kg) 76.6 kg 77.6 kg 77.6 kg      Telemetry    Sinus rhythm, no adverse arrhythmias, PACs- Personally Reviewed  ECG     Deep T wave inversions noted anterior leads- Personally Reviewed  Physical Exam   Currently laying comfortably on cath table, normal respiratory effort  Labs    High Sensitivity Troponin:   Recent Labs  Lab 08/03/20 2101 08/03/20 2251 08/04/20 0625 08/04/20 1119 08/04/20 1331  TROPONINIHS 15 64* 346* 304* 245*      Chemistry Recent Labs  Lab 08/03/20 2101 08/04/20 0625 08/05/20 0452 08/06/20 0842 08/07/20 0055  NA 139 140 140 138 138  K 2.9* 4.6 4.3 4.2 4.1  CL 103 105 105 104 104  CO2 20* 24 24 25 25   GLUCOSE 249* 150* 138* 109* 117*  BUN 14 18 24* 26* 24*  CREATININE 1.16* 1.30* 1.85* 1.66* 1.53*  CALCIUM 8.9 9.0 8.8* 8.6* 8.9  PROT 8.4* 7.5  --   --   --   ALBUMIN 4.3 3.8  --   --   --   AST 28 27  --   --   --   ALT 16 16  --   --   --   ALKPHOS 86 73  --   --   --   BILITOT 0.3 0.3  --   --   --   GFRNONAA 50* 43* 28* 32* 36*  ANIONGAP 16* 11 11 9  9  Hematology Recent Labs  Lab 08/05/20 0452 08/06/20 0517 08/07/20 0055  WBC 11.0* 9.5 8.8  RBC 4.17 4.23 4.23  HGB 11.6* 11.7* 11.6*  HCT 35.1* 35.6* 34.8*  MCV 84.2 84.2 82.3  MCH 27.8 27.7 27.4  MCHC 33.0 32.9 33.3  RDW 14.4 14.4 14.1  PLT 191 198 192    BNP Recent Labs  Lab 08/03/20 2052  BNP 278.0*     DDimer No results for input(s): DDIMER in the last 168 hours.   Radiology    No results found.  Cardiac Studies   EF 40 to 45% with global hypokinesis.  Patient Profile     75 y.o. female admitted with uncontrolled hypertension, T wave inversions concerning for anterior wall ischemia, diabetes, elevated troponin  Assessment & Plan    Non-ST elevation myocardial infarction -Elevated troponin.  Proximal LAD lesion.  Likely direct stenting.  IVUS about to be performed.  Discussed with Dr. Eldridge Dace and catheterization high.  Cardiomyopathy -EF 40 to 45% abnormal.  LVEDP normal.  We will continue to titrate goal-directed medical therapy.  Hold off on Lasix for now given normal  LVEDP.  Perhaps gentle hydration post catheterization for acute kidney injury.  Diabetes mellitus -Per primary team.  Acute kidney injury -ARB stopped.  Originally received IV Lasix.      For questions or updates, please contact [facility] Please consult [email or web address] for contact info under        Signed, Donato Schultz, MD  08/07/2020, 8:03 AM

## 2020-08-08 ENCOUNTER — Other Ambulatory Visit: Payer: Self-pay | Admitting: Family Medicine

## 2020-08-08 ENCOUNTER — Telehealth: Payer: Self-pay | Admitting: Physician Assistant

## 2020-08-08 LAB — CBC
HCT: 33.5 % — ABNORMAL LOW (ref 36.0–46.0)
Hemoglobin: 11.6 g/dL — ABNORMAL LOW (ref 12.0–15.0)
MCH: 28 pg (ref 26.0–34.0)
MCHC: 34.6 g/dL (ref 30.0–36.0)
MCV: 80.7 fL (ref 80.0–100.0)
Platelets: 201 10*3/uL (ref 150–400)
RBC: 4.15 MIL/uL (ref 3.87–5.11)
RDW: 14 % (ref 11.5–15.5)
WBC: 8 10*3/uL (ref 4.0–10.5)
nRBC: 0 % (ref 0.0–0.2)

## 2020-08-08 LAB — GLUCOSE, CAPILLARY
Glucose-Capillary: 111 mg/dL — ABNORMAL HIGH (ref 70–99)
Glucose-Capillary: 161 mg/dL — ABNORMAL HIGH (ref 70–99)
Glucose-Capillary: 107 mg/dL — ABNORMAL HIGH (ref 70–99)
Glucose-Capillary: 97 mg/dL (ref 70–99)

## 2020-08-08 LAB — BASIC METABOLIC PANEL
Anion gap: 12 (ref 5–15)
BUN: 21 mg/dL (ref 8–23)
CO2: 22 mmol/L (ref 22–32)
Calcium: 9 mg/dL (ref 8.9–10.3)
Chloride: 105 mmol/L (ref 98–111)
Creatinine, Ser: 1.36 mg/dL — ABNORMAL HIGH (ref 0.44–1.00)
GFR, Estimated: 41 mL/min — ABNORMAL LOW (ref >60)
Glucose, Bld: 105 mg/dL — ABNORMAL HIGH (ref 70–99)
Potassium: 3.7 mmol/L (ref 3.5–5.1)
Sodium: 139 mmol/L (ref 135–145)

## 2020-08-08 MED ORDER — LOSARTAN POTASSIUM 25 MG PO TABS: 25.0000 mg | ORAL_TABLET | Freq: Every day | ORAL | Status: DC

## 2020-08-08 MED ORDER — CARVEDILOL 6.25 MG PO TABS
6.2500 mg | ORAL_TABLET | Freq: Two times a day (BID) | ORAL | Status: DC
  Administered 2020-08-08 – 2020-08-09 (×2): 6.25 mg via ORAL
  Filled 2020-08-08 (×2): qty 1

## 2020-08-08 MED ORDER — TICAGRELOR 90 MG PO TABS: 90.0000 mg | ORAL_TABLET | Freq: Two times a day (BID) | ORAL | 3 refills | Status: DC

## 2020-08-08 MED ORDER — ASPIRIN 81 MG PO CHEW: 81.0000 mg | CHEWABLE_TABLET | Freq: Every day | ORAL | Status: DC

## 2020-08-08 MED ORDER — SACUBITRIL-VALSARTAN 24-26 MG PO TABS
1.0000 | ORAL_TABLET | Freq: Two times a day (BID) | ORAL | Status: DC
  Administered 2020-08-08 – 2020-08-09 (×3): 1 via ORAL
  Filled 2020-08-08 (×3): qty 1

## 2020-08-08 MED ORDER — ATORVASTATIN CALCIUM 40 MG PO TABS: 40.0000 mg | ORAL_TABLET | Freq: Every day | ORAL | 3 refills | Status: DC

## 2020-08-08 MED ORDER — DAPAGLIFLOZIN PROPANEDIOL 10 MG PO TABS: 10.0000 mg | ORAL_TABLET | Freq: Every day | ORAL | 3 refills | Status: DC

## 2020-08-08 MED ORDER — CARVEDILOL 12.5 MG PO TABS: 12.5000 mg | ORAL_TABLET | Freq: Two times a day (BID) | ORAL | 3 refills | Status: DC

## 2020-08-08 MED ORDER — POLYETHYLENE GLYCOL 3350 17 G PO PACK: 17.0000 g | PACK | Freq: Every day | ORAL | 0 refills | Status: DC | PRN

## 2020-08-08 MED ORDER — SACUBITRIL-VALSARTAN 24-26 MG PO TABS: 1.0000 | ORAL_TABLET | Freq: Two times a day (BID) | ORAL | 3 refills | Status: DC

## 2020-08-08 MED ORDER — DAPAGLIFLOZIN PROPANEDIOL 10 MG PO TABS
10.0000 mg | ORAL_TABLET | Freq: Every day | ORAL | Status: DC
  Administered 2020-08-08 – 2020-08-09 (×2): 10 mg via ORAL
  Filled 2020-08-08 (×2): qty 1

## 2020-08-08 MED ORDER — ISOSORBIDE MONONITRATE ER 30 MG PO TB24: 30.0000 mg | ORAL_TABLET | Freq: Every day | ORAL | 3 refills | Status: AC

## 2020-08-08 MED FILL — FARXIGA 10 MG TABLET: 10 | 30 days supply | Qty: 30 | Fill #0

## 2020-08-08 MED FILL — ISOSORBIDE MN ER 30 MG TAB: 30 | 30 days supply | Qty: 30 | Fill #0

## 2020-08-08 MED FILL — ATORVASTATIN CALCIUM 40 MG: 40 | 30 days supply | Qty: 30 | Fill #0

## 2020-08-08 MED FILL — ENTRESTO 24 MG-26 MG TABLET: 24-26 | 30 days supply | Qty: 60 | Fill #0

## 2020-08-08 MED FILL — CARVEDILOL 12.5 MG TABLET: 12.5 | 30 days supply | Qty: 60 | Fill #0

## 2020-08-08 MED FILL — BRILINTA 90 MG TABLET: 90 | 30 days supply | Qty: 60 | Fill #0

## 2020-08-08 MED FILL — POLYETHYLENE GLYCOL 3350 PO: 17 | 14 days supply | Qty: 238 | Fill #0

## 2020-08-08 NOTE — TOC Progression Note (Addendum)
Transition of Care [facility]) - Progression Note    Patient Details  Name: Cassandra Johnson MRN: [id number] Date of Birth: [birth year]  [facility] CM/SW Contact  Leone Haven, RN Phone Number: 08/08/2020, 11:45 AM  Clinical Narrative:    NCM called to speak with patient in the room, asked if the pharmacist went over the co pay amt with her for her meds, she handed the phone to her Staff RN.  NCM informed Staff RN of  The co pay cost. Staff RN asked patient who is her PCP, she did not get an  Answer, NCM will check back later.  NCM called back and spoke to daughter who was in the room , she states patient does not have a PCP. She would like an apt with Dr. Michel Santee at the [facility].  NCM made follow up apt there with [place] and then on her next apt she can change to Dr. Michel Santee per the Secretary there .  Daughter was ok with this.    Expected Discharge Plan: Home w Home Health Services Barriers to Discharge: Continued Medical Work up  Expected Discharge Plan and Services Expected Discharge Plan: Home w Home Health Services In-house Referral: NA Discharge Planning Services: NA   Living arrangements for the past 2 months: Single Family Home Expected Discharge Date: 08/08/20               DME Arranged: N/A DME Agency: NA       HH Arranged: NA HH Agency: NA         Social Determinants of Health (SDOH) Interventions    Readmission Risk Interventions No flowsheet data found.

## 2020-08-08 NOTE — Plan of Care (Signed)
  Problem: Education: Goal: Knowledge of General Education information will improve Description Including pain rating scale, medication(s)/side effects and non-pharmacologic comfort measures Outcome: Progressing   Problem: Health Behavior/Discharge Planning: Goal: Ability to manage health-related needs will improve Outcome: Progressing   

## 2020-08-08 NOTE — TOC Benefit Eligibility Note (Signed)
Transition of Care [facility]) Benefit Eligibility Note    Patient Details  Name: Cassandra Johnson MRN: [id number] Date of Birth: Oct 03, 1945   Medication/Dose: BRILINTA  90 MG BID  ,  ENTRSTO  24-26 MG BID   and   FARXIGA  10 MG DAILY        Prescription Coverage Preferred [facility]acy: [facility] [place]  and    S/W NICK  @  [facility]  NO PHARMACY BENEFITS              Additional Notes: NO PHARMACY BENEFITS ON [address] Phone Number: 08/08/2020, 1:00 PM

## 2020-08-08 NOTE — Progress Notes (Signed)
Heart Failure Patient Advocate Encounter  Completed application for [employer] Patient Assistance Program sent in an effort to reduce the patient's out of pocket expense for Entresto to $0.     Application completed and faxed to [phone].   [employer] patient assistance phone number for follow up is [phone].   [place] sent in an effort to reduce the patient's out of pocket expense for [place] and [place] to $0.     Application completed and faxed to [phone]   [place] patient [place] phone number for follow up is [phone].   Sharen Hones, PharmD, BCPS Heart Failure Stewardship Pharmacist Phone [phone]  Please check [email or web address] for unit-specific pharmacist phone numbers

## 2020-08-08 NOTE — Discharge Summary (Addendum)
Physician Discharge Summary  Cassandra Johnson LKT:[id number] DOB: [birth year] DOA: 08/03/2020  PCP: Patient, No Pcp Per  Admit date: 08/03/2020 Discharge date: 08/08/2020  Time spent: 40 minutes  Recommendations for Outpatient Follow-up:  1. May need discussion about possible DM ty ii, and work-up 2. New meds ticagrelor, asa 3. Needs Cmet and CBC 1 week   Discharge Diagnoses:  MAIN problem for hospitalization   UNstable angina status post PCI  Please see below for itemized issues addressed in HOpsital- refer to other progress notes for clarity if needed  Discharge Condition:   Diet recommendation: heart healthy  Filed Weights   08/06/20 1950 08/07/20 0509 08/08/20 0439  Weight: 77.6 kg 76.6 kg 76 kg    History of present illness:  75 year old white female community dwelling Untreated HTN,  dietary indiscretions Prior hospitalization 2009 shortness of breath volume overload Presented to [facility] 3/5 short of breath, cough (Covid negative) Evaluation revealed hypoxic respiratory failure CXR = CHF, EKG without ischemic changes  Cardiology consulted-patient started on diuresis In the interim also found to have on follow-up EKG anterolateral T wave inversions ST-T wave changes and a new diagnosis of DM TY 2 Patient was transferred to 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 This was a small vessel.  Ost LAD to Prox LAD lesion is 70% stenosed. A drug-eluting stent was successfully placed using a STENT RESOLUTE ONYX 4.0X15 and optimized with intravascular ultrasound.  Mid LAD lesion is 25% stenosed.  Dist LAD lesion is 10% stenosed.  Post intervention, there is a 0% residual stenosis.  LV end diastolic pressure is normal. LVEDP 8 mm Hg.  There is no aortic valve stenosis.  A drug-eluting stent was successfully placed using a STENT RESOLUTE ONYX 4.0X15.    (i.e. Studies not automatically included, echos, thoracentesis, etc; not x-rays)  Consultations:  Cardiology  Discharge Exam: Vitals:   08/08/20 0800 08/08/20 0844  BP: (!) 152/92 (!) 168/90  Pulse: (!) 55 (!) 111  Resp: 15   Temp: 98.7 F (37.1 C)   SpO2: 97%     Subj on day of d/c   Awake alert pleasant and cheerful--no CP  General Exam on discharge  No distress cta b no added sound  abd soft nt nd no rebound no guard Neuro intact s1 s 2no m HR 60's now at baseline   Discharge Instructions  Discharge Instructions    AMB Referral to Cardiac Rehabilitation - Phase II   Complete by: As directed    Diagnosis:  Coronary Stents NSTEMI     After initial evaluation and assessments completed: Virtual Based Care may be provided alone or in conjunction with Phase 2 Cardiac Rehab based on patient barriers.: Yes   Diet - low sodium heart healthy   Complete by: As directed    Discharge instructions   Complete by: As directed    Look at your medications carefully you will be on 2 separate blood thinners and remaining from the [facility] Your cardiologist who sees you in [place] will talk to you about continuing these medications versus adjusting the doses based on lab work if you have any  swelling of your lower extremities, chest pain, nausea shortness of breath you need to call your regular doctor and schedule a follow-up I would recommend that you wear yourself ever day and if you gain more than 2 to 3 pounds in 24 to 48 hours.  You need to call the cardiologist on call and ask them instructions   Increase activity slowly   Complete by: As directed      Allergies as of 08/09/2020   No Known Allergies     Medication List    TAKE these medications   aspirin 81 MG chewable tablet Chew 1 tablet (81 mg total) by mouth daily.   atorvastatin 40 MG tablet Commonly known as: LIPITOR Take 1 tablet (40 mg total) by mouth daily at 6 PM.   carvedilol 6.25 MG tablet Commonly known as: COREG Take 1 tablet (6.25 mg total) by mouth 2 (two) times daily with a meal.   dapagliflozin propanediol 10 MG Tabs tablet Commonly known as: FARXIGA Take 1 tablet (10 mg total) by mouth daily.   isosorbide mononitrate 30 MG 24 hr tablet Commonly known as: IMDUR Take 1 tablet (30 mg total) by mouth daily.   polyethylene glycol 17 g packet Commonly known as: MIRALAX / GLYCOLAX Take 17 g by mouth daily as needed for mild constipation.   sacubitril-valsartan 24-26 MG Commonly known as: ENTRESTO Take 1 tablet by mouth 2 (two) times daily.   ticagrelor 90 MG Tabs tablet Commonly known as: BRILINTA Take 1 tablet (90 mg total) by mouth 2 (two) times daily.      No Known Allergies  Follow-up Information    Netta Neat., NP Follow up.   Specialty: Cardiology Why: [facility] (Cardiology) - [place] location - Tuesday Aug 21, 2020 at 9:00 AM (Arrive by 8:45 AM). Nena Polio is one of the NPs with our cardiology team. Contact information: [address] [place] [address] [phone]                The results of significant diagnostics from this hospitalization (including imaging, microbiology, ancillary and laboratory) are listed below for reference.     Significant Diagnostic Studies: CARDIAC CATHETERIZATION  Result Date: 08/07/2020  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 This was a small vessel.  Ost LAD to Prox LAD lesion is 70% stenosed. A drug-eluting stent was successfully placed using a STENT RESOLUTE ONYX 4.0X15 and optimized with intravascular ultrasound.  Mid LAD lesion is 25% stenosed.  Dist LAD lesion is 10% stenosed.  Post intervention, there is a 0% residual stenosis.  LV end diastolic pressure is normal. LVEDP 8 mm Hg.  There is no aortic valve stenosis.  A drug-eluting stent was successfully placed  using a STENT RESOLUTE ONYX 4.0X15.  Due to severe tortuosity at the proximal LAD, it was difficult to cross into the mid LAD.  Super cross catheter would probably be needed in the future if access to the LAD was required. She also had some tortuosity in the right subclavian.  The right radial pulse was faint although by ultrasound, it appeared the right radial was patent.  The ulnar artery appeared to be larger which is why we chose this approach.  There was some vasospasm in the ulnar which was treated with additional verapamil at the end of the case.   DG Chest Port 1 View  Result Date: 08/04/2020 CLINICAL DATA:  CHF EXAM: PORTABLE CHEST 1 VIEW COMPARISON:  Yesterday FINDINGS: Resolved pulmonary edema. Stable generous heart size with aortic tortuosity. No visible effusion or pneumothorax. IMPRESSION: Resolved pulmonary edema. Electronically Signed   By: Marnee Spring M.D.   On: 08/04/2020 05:56   DG Chest Port 1 View  Result Date: 08/03/2020 CLINICAL DATA:  Shortness of breath, respiratory distress EXAM: PORTABLE CHEST 1 VIEW COMPARISON:  None. FINDINGS: Diffuse hazy perihilar and basilar predominant opacities in both lungs with indistinct pulmonary vascularity. No pneumothorax or visible effusion. Enlarged cardiomediastinal silhouette. No acute osseous or soft tissue abnormality. Degenerative changes are present in the imaged spine and  shoulders. Telemetry leads overlie the chest. IMPRESSION: Cardiomegaly with diffuse hazy perihilar and basilar predominant opacities, most likely edema/CHF. Infection not fully excluded. Electronically Signed   By: Kreg Shropshire M.D.   On: 08/03/2020 21:04   ECHOCARDIOGRAM COMPLETE  Result Date: 08/04/2020    ECHOCARDIOGRAM REPORT   Patient Name:   Cassandra Johnson Date of Exam: 08/04/2020 Medical Rec #:  [id number]         Height:       67.0 in Accession #:    [id number]        Weight:       200.0 lb Date of Birth:  1945-06-19        BSA:          2.022 m Patient Age:    74 years          BP:           192/104 mmHg Patient Gender: F                 HR:           85 bpm. [facility]am [facility] Procedure: 2D Echo Indications:    Congestive Heart Failure I50.9  History:        Patient has no prior history of Echocardiogram examinations.                 CHF; Risk Factors:Hypertension and Non-Smoker. Acute Respiratory                 Failure.  Sonographer:    Jeryl Columbia RDCS (AE) Referring Phys: [id number] ASIA B ZIERLE-GHOSH IMPRESSIONS  1. Left ventricular ejection fraction, by estimation, is 40 to 45%. The left ventricle has mildly decreased function. The left ventricle demonstrates global hypokinesis. There is moderate concentric left ventricular hypertrophy. Left ventricular diastolic parameters are indeterminate.  2. Right ventricular systolic function is normal. The right ventricular size is normal.  3. Left atrial size was mildly dilated.  4. The mitral valve is grossly normal. Trivial mitral valve regurgitation. Moderate to severe mitral annular calcification.  5. The aortic valve is tricuspid. Aortic valve regurgitation is not visualized. No  aortic stenosis is present.  6. The inferior vena cava is normal in size with greater than 50% respiratory variability, suggesting right atrial pressure of 3 mmHg. Comparison(s): No prior Echocardiogram. Conclusion(s)/Recommendation(s): EF 40-45% with global  hypokinesis, moderate LVH. No significant valve disease. FINDINGS  Left Ventricle: Left ventricular ejection fraction, by estimation, is 40 to 45%. The left ventricle has mildly decreased function. The left ventricle demonstrates global hypokinesis. The left ventricular internal cavity size was normal in size. There is  moderate concentric left ventricular hypertrophy. Left ventricular diastolic parameters are indeterminate. Right Ventricle: The right ventricular size is normal. No increase in right ventricular wall thickness. Right ventricular systolic function is normal. Left Atrium: Left atrial size was mildly dilated. Right Atrium: Right atrial size was normal in size. Pericardium: There is no evidence of pericardial effusion. Mitral Valve: The mitral valve is grossly normal. There is mild thickening of the mitral valve leaflet(s). There is mild calcification of the mitral valve leaflet(s). Moderate to severe mitral annular calcification. Trivial mitral valve regurgitation. Tricuspid Valve: The tricuspid valve is normal in structure. Tricuspid valve regurgitation is trivial. No evidence of tricuspid stenosis. Aortic Valve: The aortic valve is tricuspid. Aortic valve regurgitation is not visualized. No aortic stenosis is present. Pulmonic Valve: The pulmonic valve was grossly normal. Pulmonic valve regurgitation is not visualized. Aorta: The aortic root, ascending aorta and aortic arch are all structurally normal, with no evidence of dilitation or obstruction. Venous: The inferior vena cava is normal in size with greater than 50% respiratory variability, suggesting right atrial pressure of 3 mmHg. IAS/Shunts: The atrial septum is grossly normal.  LEFT VENTRICLE PLAX 2D LVIDd:         3.21 cm  Diastology LVIDs:         2.66 cm  LV e' medial:    3.38 cm/s LV PW:         1.58 cm  LV E/e' medial:  19.7 LV IVS:        1.58 cm  LV e' lateral:   4.80 cm/s LVOT diam:     2.00 cm  LV E/e' lateral: 13.9 LVOT Area:     3.14  cm  RIGHT VENTRICLE RV S prime:     15.60 cm/s LEFT ATRIUM             Index       RIGHT ATRIUM           Index LA diam:        2.80 cm 1.38 cm/m  RA Area:     13.70 cm LA Vol (A2C):   56.6 ml 27.99 ml/m RA Volume:   33.70 ml  16.66 ml/m LA Vol (A4C):   59.2 ml 29.27 ml/m LA Biplane Vol: 58.8 ml 29.08 ml/m   AORTA Ao Root diam: 3.10 cm MITRAL VALVE MV Area (PHT): 3.72 cm    SHUNTS MV Decel Time: 204 msec    Systemic Diam: 2.00 cm MV E velocity: 66.70 cm/s MV A velocity: 90.30 cm/s MV E/A ratio:  0.74 Jodelle RedBridgette Christopher MD Electronically signed by Jodelle RedBridgette Christopher MD Signature Date/Time: 08/04/2020/1:58:11 PM    Final     Microbiology: Recent Results (from the past 240 hour(s))  Resp Panel by RT-PCR (Flu A&B, Covid) Nasopharyngeal Swab     Status: None   Collection Time: 08/03/20  9:02 PM   Specimen: Nasopharyngeal Swab; Nasopharyngeal(NP) swabs in vial transport medium  Result Value Ref Range Status   SARS Coronavirus 2 by RT PCR  NEGATIVE NEGATIVE Final    Comment: (NOTE) SARS-CoV-2 target nucleic acids are NOT DETECTED.  The SARS-CoV-2 RNA is generally detectable in upper respiratory specimens during the acute phase of infection. The lowest concentration of SARS-CoV-2 viral copies this assay can detect is 138 copies/mL. A negative result does not preclude SARS-Cov-2 infection and should not be used as the sole basis for treatment or other patient management decisions. A negative result may occur with  improper specimen collection/handling, submission of specimen other than nasopharyngeal swab, presence of viral mutation(s) within the areas targeted by this assay, and inadequate number of viral copies(<138 copies/mL). A negative result must be combined with clinical observations, patient history, and epidemiological information. The expected result is Negative.  Fact Sheet for Patients:  BloggerCourse.com  Fact Sheet for Healthcare Providers:   SeriousBroker.it  This test is no t yet approved or cleared by the [place] FDA and  has been authorized for detection and/or diagnosis of SARS-CoV-2 by FDA under an Emergency Use Authorization (EUA). This EUA will remain  in effect (meaning this test can be used) for the duration of the COVID-19 declaration under Section 564(b)(1) of the Act, 21 U.S.C.section 360bbb-3(b)(1), unless the authorization is terminated  or revoked sooner.       Influenza A by PCR NEGATIVE NEGATIVE Final   Influenza B by PCR NEGATIVE NEGATIVE Final    Comment: (NOTE) The Xpert Xpress SARS-CoV-2/FLU/RSV plus assay is intended as an aid in the diagnosis of influenza from Nasopharyngeal swab specimens and should not be used as a sole basis for treatment. Nasal washings and aspirates are unacceptable for Xpert Xpress SARS-CoV-2/FLU/RSV testing.  Fact Sheet for Patients: BloggerCourse.com  Fact Sheet for Healthcare Providers: SeriousBroker.it  This test is not yet approved or cleared by the [place] FDA and has been authorized for detection and/or diagnosis of SARS-CoV-2 by FDA under an Emergency Use Authorization (EUA). This EUA will remain in effect (meaning this test can be used) for the duration of the COVID-19 declaration under Section 564(b)(1) of the Act, 21 U.S.C. section 360bbb-3(b)(1), unless the authorization is terminated or revoked.  Performed at [facility], [address], [place], [place] [address]   MRSA PCR Screening     Status: None   Collection Time: 08/04/20  2:11 AM   Specimen: Nasal Mucosa; Nasopharyngeal  Result Value Ref Range Status   MRSA by PCR NEGATIVE NEGATIVE Final    Comment:        The GeneXpert MRSA Assay (FDA approved for NASAL specimens only), is one component of a comprehensive MRSA colonization surveillance program. It is not intended to diagnose MRSA infection nor to guide  or monitor treatment for MRSA infections. Performed at [facility], [address] [place], [place] [address]      Labs: Basic Metabolic Panel: Recent Labs  Lab 08/04/20 [id number] 08/05/20 0452 08/06/20 0842 08/07/20 0055 08/08/20 0202  NA 140 140 138 138 139  K 4.6 4.3 4.2 4.1 3.7  CL 105 105 104 104 105  CO2 GLUCOSE 150* 138* 109* 117* 105*  BUN 18 24* 26* 24* 21  CREATININE 1.30* 1.85* 1.66* 1.53* 1.36*  CALCIUM 9.0 8.8* 8.6* 8.9 9.0  MG  --  1.8  --   --   --    Liver Function Tests: Recent Labs  Lab 08/03/20 2101 08/04/20 0625  AST 28 27  ALT 16 16  ALKPHOS 86 73  BILITOT 0.3 0.3  PROT 8.4* 7.5  ALBUMIN 4.3 3.8   No results for input(s): LIPASE, AMYLASE in the last 168 hours. No results for input(s): AMMONIA in the last 168 hours. CBC: Recent Labs  Lab 08/03/20 2101 08/04/20 1119 08/05/20 0452 08/06/20 0517 08/07/20 0055 08/08/20 0202  WBC 13.9* 11.8* 11.0* 9.5 8.8 8.0  NEUTROABS 6.8  --   --   --   --   --   HGB 15.3* 12.8 11.6* 11.7* 11.6* 11.6*  HCT 48.1* 40.2 35.1* 35.6* 34.8* 33.5*  MCV 86.7 86.1 84.2 84.2 82.3 80.7  PLT 247 224 191 198 192 201   Cardiac Enzymes: No results for input(s): CKTOTAL, CKMB, CKMBINDEX, TROPONINI in the last 168 hours. BNP: BNP (last 3 results) Recent Labs    08/03/20 2052  BNP 278.0*    ProBNP (last 3 results) No results for input(s): PROBNP in the last 8760 hours.  CBG: Recent Labs  Lab 08/07/20 1053 08/07/20 1551 08/07/20 2121 08/08/20 0608 08/08/20 1113  GLUCAP 137* 93 104* 111* 161*       Signed:  Rhetta Mura MD   [facility] 08/08/2020, 11:14 AM

## 2020-08-08 NOTE — Progress Notes (Addendum)
Progress Note  Patient Name: Cassandra Johnson Date of Encounter: 08/08/2020  [facility] Cardiologist: Nona Dell, MD   Subjective   Feels better post catheterization.  No significant shortness of breath.  Feels well.  Ready to go home.  She lives in [place].  She has not been taking blood pressure medications.  Inpatient Medications    Scheduled Meds: . aspirin  81 mg Oral Daily  . atorvastatin  40 mg Oral q1800  . carvedilol  12.5 mg Oral BID WC  . Chlorhexidine Gluconate Cloth  6 each Topical Daily  . insulin aspart  0-15 Units Subcutaneous TID WC  . insulin aspart  0-5 Units Subcutaneous QHS  . isosorbide mononitrate  30 mg Oral Daily  . sodium chloride flush  3 mL Intravenous Q12H  . ticagrelor  90 mg Oral BID   Continuous Infusions: . sodium chloride     PRN Meds: sodium chloride, acetaminophen, hydrALAZINE, ondansetron (ZOFRAN) IV, ondansetron **OR** [DISCONTINUED] ondansetron (ZOFRAN) IV, oxyCODONE, polyethylene glycol, sodium chloride flush   Vital Signs    Vitals:   08/08/20 0439 08/08/20 0730 08/08/20 0800 08/08/20 0844  BP: (!) 160/96 (!) 152/97 (!) 152/92 (!) 168/90  Pulse: 61 (!) 58 (!) 55 (!) 111  Resp: 13 16 15    Temp: 98.4 F (36.9 C) 98.3 F (36.8 C) 98.7 F (37.1 C)   TempSrc: Oral Oral Oral   SpO2: 99% 97% 97%   Weight: 76 kg     Height:        Intake/Output Summary (Last 24 hours) at 08/08/2020 0859 Last data filed at 08/08/2020 0800 Gross per 24 hour  Intake --  Output 1400 ml  Net -1400 ml   Last 3 Weights 08/08/2020 08/07/2020 08/06/2020  Weight (lbs) 167 lb 8.8 oz 168 lb 14 oz 171 lb 1.2 oz  Weight (kg) 76 kg 76.6 kg 77.6 kg      Telemetry    No adverse rhythms- Personally Reviewed  ECG    Sinus rhythm nonspecific ST-T wave changes.  Improved T wave in anterior precordial leads- Personally Reviewed  Physical Exam   GEN: No acute distress.   Neck: No JVD Cardiac: RRR, no murmurs, rubs, or gallops.  Right  radial cath site looks great. Respiratory: Clear to auscultation bilaterally. GI: Soft, nontender, non-distended  MS: No edema; No deformity. Neuro:  Nonfocal  Psych: Normal affect   Labs    High Sensitivity Troponin:   Recent Labs  Lab 08/03/20 2101 08/03/20 2251 08/04/20 0625 08/04/20 1119 08/04/20 1331  TROPONINIHS 15 64* 346* 304* 245*      Chemistry Recent Labs  Lab 08/03/20 2101 08/04/20 0625 08/05/20 0452 08/06/20 0842 08/07/20 0055 08/08/20 0202  NA 139 140   < > 138 138 139  K 2.9* 4.6   < > 4.2 4.1 3.7  CL 103 105   < > 104 104 105  CO2 20* 24   < > 25 25 22   GLUCOSE 249* 150*   < > 109* 117* 105*  BUN 14 18   < > 26* 24* 21  CREATININE 1.16* 1.30*   < > 1.66* 1.53* 1.36*  CALCIUM 8.9 9.0   < > 8.6* 8.9 9.0  PROT 8.4* 7.5  --   --   --   --   ALBUMIN 4.3 3.8  --   --   --   --   AST 28 27  --   --   --   --  ALT 16 16  --   --   --   --   ALKPHOS 86 73  --   --   --   --   BILITOT 0.3 0.3  --   --   --   --   GFRNONAA 50* 43*   < > 32* 36* 41*  ANIONGAP 16* 11   < > 9 9 12    < > = values in this interval not displayed.     Hematology Recent Labs  Lab 08/06/20 0517 08/07/20 0055 08/08/20 0202  WBC 9.5 8.8 8.0  RBC 4.23 4.23 4.15  HGB 11.7* 11.6* 11.6*  HCT 35.6* 34.8* 33.5*  MCV 84.2 82.3 80.7  MCH 27.7 27.4 28.0  MCHC 32.9 33.3 34.6  RDW 14.4 14.1 14.0  PLT 198 192 201    BNP Recent Labs  Lab 08/03/20 2052  BNP 278.0*     DDimer No results for input(s): DDIMER in the last 168 hours.   Radiology    CARDIAC CATHETERIZATION  Result Date: 08/07/2020  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 This was a small vessel.  Ost LAD to Prox LAD lesion is 70% stenosed. A drug-eluting stent was successfully placed using a STENT RESOLUTE ONYX 4.0X15 and optimized with intravascular ultrasound.  Mid LAD lesion is 25% stenosed.  Dist LAD lesion is 10% stenosed.  Post intervention, there is a 0% residual stenosis.  LV end diastolic pressure is normal.  LVEDP 8 mm Hg.  There is no aortic valve stenosis.  A drug-eluting stent was successfully placed using a STENT RESOLUTE ONYX 4.0X15.  Due to severe tortuosity at the proximal LAD, it was difficult to cross into the mid LAD.  Super cross catheter would probably be needed in the future if access to the LAD was required. She also had some tortuosity in the right subclavian.  The right radial pulse was faint although by ultrasound, it appeared the right radial was patent.  The ulnar artery appeared to be larger which is why we chose this approach.  There was some vasospasm in the ulnar which was treated with additional verapamil at the end of the case.    Cardiac Studies   Cath 08/07/20:   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 This was a small vessel.  Ost LAD to Prox LAD lesion is 70% stenosed. A drug-eluting stent was successfully placed using a STENT RESOLUTE ONYX 4.0X15 and optimized with intravascular ultrasound.  Mid LAD lesion is 25% stenosed.  Dist LAD lesion is 10% stenosed.  Post intervention, there is a 0% residual stenosis.  LV end diastolic pressure is normal. LVEDP 8 mm Hg.  There is no aortic valve stenosis.  A drug-eluting stent was successfully placed using a STENT RESOLUTE ONYX 4.0X15.   Due to severe tortuosity at the proximal LAD, it was difficult to cross into the mid LAD.  Super cross catheter would probably be needed in the future if access to the LAD was required.  She also had some tortuosity in the right subclavian.  The right radial pulse was faint although by ultrasound, it appeared the right radial was patent.  The ulnar artery appeared to be larger which is why we chose this approach.  There was some vasospasm in the ulnar which was treated with additional verapamil at the end of the case.  Diagnostic Dominance: Right    Intervention     Patient Profile     75 y.o. female with anginal symptoms status post PCI to proximal  LAD.  Original EKG showed deep T wave  inversions in the anterior leads suggestive of LAD ischemia.  Transferred from [facility] for heart catheterization.  Assessment & Plan    Coronary artery disease -Proximal LAD stent.  Dual antiplatelet therapy 1 year.  Non-ST elevation myocardial infarction -Peak troponin was 346 -Goal-directed medical therapy, aspirin, Brilinta, carvedilol, Entresto, Farxiga, atorvastatin high intensity 40 mg.  Isosorbide 30 mg.  Diabetes control.  Cardiac rehab.  Diabetes mellitus with acute kidney injury -Creatinine stable.  1.3 range.  Avoid NSAIDs.  ARB was stopped.  We will go ahead and start Entresto 24/26 twice daily - ejection fraction remains 40 to 45%. -Will start Farxiga-- SGLT2 inhibitor  Ischemic cardiomyopathy -EF 40 to 45%.  On carvedilol.  Adding Entresto. [name]. Would be careful with spironolactone given creatinine.  Reasonable for discharge today. Follow-up with Dr. Diona Browner, [facility] clinic would be best for her given that she lives in [place].     For questions or updates, please contact [facility] Please consult [email or web address] for contact info under        Signed, Donato Schultz, MD  08/08/2020, 8:59 AM

## 2020-08-08 NOTE — Progress Notes (Addendum)
CARDIAC REHAB PHASE I   PRE:  Rate/Rhythm: 61 SR  BP:  Supine: 140/89  Sitting:   Standing:    SaO2: 93 RA  MODE:  Ambulation: 434ft   POST:  Rate/Rhythm: 75 SR  BP:  Supine:   Sitting: 136/79  Standing:    SaO2: 95 SR with PVC  Pt tolerated exercise well and amb 400 ft with no assistive device and no chest pain or pain. Pt had mild SOB and one rest break was taken for a few seconds. Pt returned to recliner and started education. Education given on MI book, Aspirin and Brilinta adherence, radial restrictions, heart healthy diet, low sodium diet, daily weights. Then pt became symptomatic and began feeling like she needed to vomit. Emesis bag given and pt wanted to use the rest room. Nurse was called and pt became dizzy once in restroom. Pt was returned to bed and vitals were taken by the nurse. Pt fatigued, will be back later for further education. Left pt in the bed and the nurse was in the room.   Pt needs education on diabetic diet, Home exercise guidelines, NTG usage, and cardiac rehab phase 2 referral.   [phone] Harrie Jeans ACSM-EP 08/08/2020 11:30 AM

## 2020-08-08 NOTE — Progress Notes (Signed)
PROGRESS NOTE   Cassandra Johnson  FXT:[id number] DOB: 24-Dec-1945 DOA: 08/03/2020 PCP: Patient, No Pcp Per  Brief Narrative:  75 year old white female community dwelling Untreated HTN,  dietary indiscretions Prior hospitalization 2009 shortness of breath volume overload Presented to [facility] 3/5 short of breath, cough (Covid negative) Evaluation revealed hypoxic respiratory failure CXR = CHF, EKG without ischemic changes  Cardiology consulted-patient started on diuresis In the interim also found to have on follow-up EKG anterolateral T wave inversions ST-T wave changes and a new diagnosis of DM TY 2 Patient was transferred to [facility] 3/7 for consideration of cardiac cath  Hospital-Problem based course  Unstable angina status post stent 08/07/2020 as below Hypertensive urgency HF R EF acute  no chest pain on presentation-blood troponins peaked at  346  Meds have been adjusted today per cardiology--- vomit  today probably because of bradycardia therefore meds  adjusted per Dr. 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  Will need UA to check for proteinuria  Eventually may need ACE inhibitor DM TY 2 new onset A1c 6.9  Nursing to teach how to use glucometer  Will start likely glipizide on discharge and outpatient titration  DVT prophylaxis: Lovenox Code Status: Full Family Communication: No family present today Disposition:  Status is: Inpatient-I anticipate she will need at least 24-48 more hours to adjust her meds and make sure she can tolerate them  Remains inpatient appropriate because:Hemodynamically unstable, Ongoing active pain requiring inpatient pain management and IV treatments appropriate due to intensity of illness or inability to take PO   Dispo: The patient is from: Home              Anticipated d/c is to: Home               Patient currently is not medically stable to d/c.   Difficult to place patient No       Consultants:   Cardiologist  Procedures: Cardiac cath as below  Antimicrobials: None   Subjective: Coherent pleasant no distress EOMI Tells me she feels "funny" Nurse reports on telemetry was bradycardic to 40s-she vomited after walking We were setting her up for discharge but after discussion with Dr. Anne Fu it looks like it may be better to keep her overnight   Objective: Vitals:   08/08/20 0800 08/08/20 0844 08/08/20 1100 08/08/20 1125  BP: (!) 152/92 (!) 168/90 114/76 (!) 142/77  Pulse: (!) 55 (!) 111 (!) 49 (!) 51  Resp: 15  14 15   Temp: 98.7 F (37.1 C)  97.6 F (36.4 C)   TempSrc: Oral  Oral   SpO2: 97%  98% 93%  Weight:      Height:        Intake/Output Summary (Last 24 hours) at 08/08/2020 1146 Last data filed at 08/08/2020 0800 Gross per 24 hour  Intake -  Output 1400 ml  Net -1400 ml   Filed Weights   08/06/20 1950 08/07/20 0509 08/08/20 0439  Weight: 77.6 kg 76.6 kg 76 kg    Examination:  Coherent a little distressed CTA B no added sound No rales or rhonchi S1-S2 no murmur-Heart rate down to 47 earlier today Abdomen soft--No rebound/guarding Neurologically intact no focal deficit ROM intact Psych euthymic pleasant  Data Reviewed: personally reviewed  Data reviewed Echo EF 40-45% concentric LV hypertrophy BUNs/creatinine 24/1.8-->24/1.5 WBC 8.8, hemoglobin 11.6 platelet 192  Cardiac Cath 3/8  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 This was a small vessel.  Ost LAD to Prox LAD lesion is 70% stenosed. A drug-eluting stent was successfully placed using a STENT RESOLUTE ONYX 4.0X15 and optimized with intravascular ultrasound.  Mid LAD lesion is 25% stenosed.  Dist LAD lesion is 10% stenosed.  Post intervention, there is a 0% residual stenosis.  LV end diastolic pressure is normal. LVEDP 8 mm Hg.  There is no aortic valve stenosis.  A drug-eluting stent was  successfully placed using a STENT RESOLUTE ONYX 4.0X15.   Due to severe tortuosity at the proximal LAD, it was difficult to cross into the mid LAD.  Super cross catheter would probably be needed in the future if access to the LAD was required.  CBC    Component Value Date/Time   WBC 8.0 08/08/2020 0202   RBC 4.15 08/08/2020 0202   HGB 11.6 (L) 08/08/2020 0202   HCT 33.5 (L) 08/08/2020 0202   PLT 201 08/08/2020 0202   MCV 80.7 08/08/2020 0202   MCH 28.0 08/08/2020 0202   MCHC 34.6 08/08/2020 0202   RDW 14.0 08/08/2020 0202   LYMPHSABS 6.1 (H) 08/03/2020 2101   MONOABS 0.7 08/03/2020 2101   EOSABS 0.3 08/03/2020 2101   BASOSABS 0.1 08/03/2020 2101   CMP Latest Ref Rng & Units 08/08/2020 08/07/2020 08/06/2020  Glucose 70 - 99 mg/dL 751(W) 258(N) 277(O)  BUN 8 - 23 mg/dL 21 24(M) 35(T)  Creatinine 0.44 - 1.00 mg/dL 6.14(E) 3.15(Q) 0.08(Q)  Sodium 135 - 145 mmol/L 139 138 138  Potassium 3.5 - 5.1 mmol/L 3.7 4.1 4.2  Chloride 98 - 111 mmol/L 105 104 104  CO2 22 - 32 mmol/L 22 25 25   Calcium 8.9 - 10.3 mg/dL 9.0 8.9 )  Total Protein 6.5 - 8.1 g/dL - - -  Total Bilirubin 0.3 - 1.2 mg/dL - - -  Alkaline Phos 38 - 126 U/L - - -  AST 15 - 41 U/L - - -  ALT 0 - 44 U/L - - -     Radiology Studies: CARDIAC CATHETERIZATION  Result Date: 08/07/2020  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 This was a small vessel.  Ost LAD to Prox LAD lesion is 70% stenosed. A drug-eluting stent was successfully placed using a STENT RESOLUTE ONYX 4.0X15 and optimized with intravascular ultrasound.  Mid LAD lesion is 25% stenosed.  Dist LAD lesion is 10% stenosed.  Post intervention, there is a 0% residual stenosis.  LV end diastolic pressure is normal. LVEDP 8 mm Hg.  There is no aortic valve stenosis.  A drug-eluting stent was successfully placed using a STENT RESOLUTE ONYX 4.0X15.  Due to severe tortuosity at the proximal LAD, it was difficult to cross into the mid LAD.  Super cross catheter would probably be  needed in the future if access to the LAD was required. She also had some tortuosity in the right subclavian.  The right radial pulse was faint although by ultrasound, it appeared the right radial was patent.  The ulnar artery appeared to be larger which is why we chose this approach.  There was some vasospasm in the ulnar which was treated with additional verapamil at the end of the case.     Scheduled Meds: . aspirin  81 mg Oral Daily  . atorvastatin  40 mg Oral q1800  . carvedilol  6.25 mg Oral BID WC  . Chlorhexidine Gluconate Cloth  6 each Topical Daily  . dapagliflozin propanediol  10 mg Oral Daily  . insulin aspart  0-15 Units Subcutaneous TID WC  . insulin aspart  0-5 Units Subcutaneous QHS  . isosorbide mononitrate  30 mg Oral Daily  . sacubitril-valsartan  1 tablet Oral BID  . sodium chloride flush  3 mL Intravenous Q12H  . ticagrelor  90 mg Oral BID   Continuous Infusions: . sodium chloride       LOS: 5 days   Time spent: 5  Rhetta Mura, MD [facility] To contact the attending provider between 7A-7P or the covering provider during after hours 7P-7A, please log into the web site [email or web address] and access using universal Joyce password for that web site. If you do not have the password, please call the hospital operator.  08/08/2020, 11:46 AM

## 2020-08-08 NOTE — Care Management Important Message (Signed)
Important Message  Patient Details  Name: Cassandra Johnson MRN: [id number] Date of Birth: May 05, 1946   Medicare Important Message Given:  Yes     Dorena Bodo 08/08/2020, 2:34 PM

## 2020-08-08 NOTE — Telephone Encounter (Signed)
    Attention TOC pool,  This patient will need a TOC phone call after discharge. They are being discharged possibly today Follow-up appointment has already been arranged with: 3/22 Nena Polio They are a patient of Nona Dell, MD.  Thank you! Laurann Montana, PA-C

## 2020-08-09 ENCOUNTER — Other Ambulatory Visit: Payer: Self-pay | Admitting: Family Medicine

## 2020-08-09 DIAGNOSIS — I5020 Unspecified systolic (congestive) heart failure: Secondary | ICD-10-CM

## 2020-08-09 LAB — CBC
HCT: 34.1 % — ABNORMAL LOW (ref 36.0–46.0)
Hemoglobin: 11.5 g/dL — ABNORMAL LOW (ref 12.0–15.0)
MCH: 27.4 pg (ref 26.0–34.0)
MCHC: 33.7 g/dL (ref 30.0–36.0)
MCV: 81.4 fL (ref 80.0–100.0)
Platelets: 198 10*3/uL (ref 150–400)
RBC: 4.19 MIL/uL (ref 3.87–5.11)
RDW: 14 % (ref 11.5–15.5)
WBC: 7.8 10*3/uL (ref 4.0–10.5)
nRBC: 0 % (ref 0.0–0.2)

## 2020-08-09 LAB — GLUCOSE, CAPILLARY: Glucose-Capillary: 95 mg/dL (ref 70–99)

## 2020-08-09 MED ORDER — CARVEDILOL 6.25 MG PO TABS: 6.2500 mg | ORAL_TABLET | Freq: Two times a day (BID) | ORAL | 3 refills | Status: DC

## 2020-08-09 NOTE — Progress Notes (Signed)
   Awaiting discharge.  Please see note from yesterday. Donato Schultz, MD

## 2020-08-09 NOTE — Progress Notes (Signed)
Discharge summary dictated 3/10 and updated Ready for discharge

## 2020-08-09 NOTE — Progress Notes (Signed)
D/C instructions given and reviewed. Questions asked and answered. Tele and IV removed, tolerated well. Daughters at bedside, will inform staff when ready to transport home.

## 2020-08-09 NOTE — Plan of Care (Signed)

## 2020-08-09 NOTE — Plan of Care (Signed)
Problem: Education: Goal: Knowledge of General Education information will improve Description: Including pain rating scale, medication(s)/side effects and non-pharmacologic comfort measures 08/09/2020 1015 by Kennedy Bucker, RN Outcome: Adequate for Discharge 08/09/2020 1015 by Kennedy Bucker, RN Outcome: Adequate for Discharge   Problem: Health Behavior/Discharge Planning: Goal: Ability to manage health-related needs will improve 08/09/2020 1015 by Kennedy Bucker, RN Outcome: Adequate for Discharge 08/09/2020 1015 by Kennedy Bucker, RN Outcome: Adequate for Discharge   Problem: Clinical Measurements: Goal: Ability to maintain clinical measurements within normal limits will improve 08/09/2020 1015 by Kennedy Bucker, RN Outcome: Adequate for Discharge 08/09/2020 1015 by Kennedy Bucker, RN Outcome: Adequate for Discharge Goal: Will remain free from infection 08/09/2020 1015 by Kennedy Bucker, RN Outcome: Adequate for Discharge 08/09/2020 1015 by Kennedy Bucker, RN Outcome: Adequate for Discharge Goal: Diagnostic test results will improve 08/09/2020 1015 by Kennedy Bucker, RN Outcome: Adequate for Discharge 08/09/2020 1015 by Kennedy Bucker, RN Outcome: Adequate for Discharge Goal: Respiratory complications will improve 08/09/2020 1015 by Kennedy Bucker, RN Outcome: Adequate for Discharge 08/09/2020 1015 by Kennedy Bucker, RN Outcome: Adequate for Discharge Goal: Cardiovascular complication will be avoided 08/09/2020 1015 by Kennedy Bucker, RN Outcome: Adequate for Discharge 08/09/2020 1015 by Kennedy Bucker, RN Outcome: Adequate for Discharge   Problem: Activity: Goal: Risk for activity intolerance will decrease 08/09/2020 1015 by Kennedy Bucker, RN Outcome: Adequate for Discharge 08/09/2020 1015 by Kennedy Bucker, RN Outcome: Adequate for Discharge   Problem: Nutrition: Goal: Adequate nutrition will be  maintained 08/09/2020 1015 by Kennedy Bucker, RN Outcome: Adequate for Discharge 08/09/2020 1015 by Kennedy Bucker, RN Outcome: Adequate for Discharge   Problem: Coping: Goal: Level of anxiety will decrease 08/09/2020 1015 by Kennedy Bucker, RN Outcome: Adequate for Discharge 08/09/2020 1015 by Kennedy Bucker, RN Outcome: Adequate for Discharge   Problem: Elimination: Goal: Will not experience complications related to bowel motility 08/09/2020 1015 by Kennedy Bucker, RN Outcome: Adequate for Discharge 08/09/2020 1015 by Kennedy Bucker, RN Outcome: Adequate for Discharge Goal: Will not experience complications related to urinary retention 08/09/2020 1015 by Kennedy Bucker, RN Outcome: Adequate for Discharge 08/09/2020 1015 by Kennedy Bucker, RN Outcome: Adequate for Discharge   Problem: Pain Managment: Goal: General experience of comfort will improve 08/09/2020 1015 by Kennedy Bucker, RN Outcome: Adequate for Discharge 08/09/2020 1015 by Kennedy Bucker, RN Outcome: Adequate for Discharge   Problem: Safety: Goal: Ability to remain free from injury will improve 08/09/2020 1015 by Kennedy Bucker, RN Outcome: Adequate for Discharge 08/09/2020 1015 by Kennedy Bucker, RN Outcome: Adequate for Discharge   Problem: Skin Integrity: Goal: Risk for impaired skin integrity will decrease 08/09/2020 1015 by Kennedy Bucker, RN Outcome: Adequate for Discharge 08/09/2020 1015 by Kennedy Bucker, RN Outcome: Adequate for Discharge   Problem: Education: Goal: Ability to demonstrate management of disease process will improve 08/09/2020 1015 by Kennedy Bucker, RN Outcome: Adequate for Discharge 08/09/2020 1015 by Kennedy Bucker, RN Outcome: Adequate for Discharge Goal: Ability to verbalize understanding of medication therapies will improve 08/09/2020 1015 by Kennedy Bucker, RN Outcome: Adequate for Discharge 08/09/2020 1015 by  Kennedy Bucker, RN Outcome: Adequate for Discharge Goal: Individualized Educational Video(s) 08/09/2020 1015 by Kennedy Bucker, RN Outcome: Adequate for Discharge 08/09/2020 1015 by Kennedy Bucker, RN Outcome: Adequate for Discharge   Problem: Activity: Goal: Capacity to carry out activities will improve 08/09/2020 1015 by Kennedy Bucker, RN Outcome: Adequate for Discharge 08/09/2020 1015 by Kennedy Bucker, RN Outcome: Adequate for Discharge   Problem: Cardiac: Goal: Ability to achieve and maintain adequate cardiopulmonary perfusion  will improve 08/09/2020 1015 by Kennedy Bucker, RN Outcome: Adequate for Discharge 08/09/2020 1015 by Kennedy Bucker, RN Outcome: Adequate for Discharge

## 2020-08-09 NOTE — Progress Notes (Addendum)
Progress Note  Patient Name: Cassandra Johnson Date of Encounter: 08/09/2020  [facility] Cardiologist: Nona Dell, MD   Subjective   Felt dizzy and nauseated after ambulation yesterday.  Coreg reduced to 6.25 mg twice daily.  No recurrent symptoms.  Denies chest pain and shortness of breath.  Inpatient Medications    Scheduled Meds: . aspirin  81 mg Oral Daily  . atorvastatin  40 mg Oral q1800  . carvedilol  6.25 mg Oral BID WC  . Chlorhexidine Gluconate Cloth  6 each Topical Daily  . dapagliflozin propanediol  10 mg Oral Daily  . insulin aspart  0-15 Units Subcutaneous TID WC  . insulin aspart  0-5 Units Subcutaneous QHS  . isosorbide mononitrate  30 mg Oral Daily  . sacubitril-valsartan  1 tablet Oral BID  . sodium chloride flush  3 mL Intravenous Q12H  . ticagrelor  90 mg Oral BID   Continuous Infusions: . sodium chloride     PRN Meds: sodium chloride, acetaminophen, hydrALAZINE, ondansetron (ZOFRAN) IV, ondansetron **OR** [DISCONTINUED] ondansetron (ZOFRAN) IV, oxyCODONE, polyethylene glycol, sodium chloride flush   Vital Signs    Vitals:   08/08/20 2019 08/09/20 0000 08/09/20 0434 08/09/20 0740  BP: (!) 156/100 (!) 161/88 (!) 190/98 (!) 164/89  Pulse: 61 63 65 66  Resp: (!) 21 13 15    Temp: 99.3 F (37.4 C) 99.3 F (37.4 C) 98.5 F (36.9 C)   TempSrc: Oral Oral Oral   SpO2: 96% 93% 98%   Weight:   75 kg   Height:        Intake/Output Summary (Last 24 hours) at 08/09/2020 0939 Last data filed at 08/09/2020 0437 Gross per 24 hour  Intake --  Output 200 ml  Net -200 ml   Last 3 Weights 08/09/2020 08/08/2020 08/07/2020  Weight (lbs) 165 lb 4.8 oz 167 lb 8.8 oz 168 lb 14 oz  Weight (kg) 74.98 kg 76 kg 76.6 kg      Telemetry    Sinus bradycardia/rhythm at rate in low 60s mostly- Personally Reviewed  ECG    n/a  Physical Exam   GEN: No acute distress.   Neck: No JVD Cardiac: RRR, no murmurs, rubs, or gallops.  Respiratory: Clear to  auscultation bilaterally. GI: Soft, nontender, non-distended  MS: No edema; No deformity. Neuro:  Nonfocal  Psych: Normal affect   Labs    High Sensitivity Troponin:   Recent Labs  Lab 08/03/20 2101 08/03/20 2251 08/04/20 0625 08/04/20 1119 08/04/20 1331  TROPONINIHS 15 64* 346* 304* 245*      Chemistry Recent Labs  Lab 08/03/20 2101 08/04/20 0625 08/05/20 0452 08/06/20 0842 08/07/20 0055 08/08/20 0202  NA 139 140   < > 138 138 139  K 2.9* 4.6   < > 4.2 4.1 3.7  CL 103 105   < > 104 104 105  CO2 20* 24   < > 25 25 22   GLUCOSE 249* 150*   < > 109* 117* 105*  BUN 14 18   < > 26* 24* 21  CREATININE 1.16* 1.30*   < > 1.66* 1.53* 1.36*  CALCIUM 8.9 9.0   < > 8.6* 8.9 9.0  PROT 8.4* 7.5  --   --   --   --   ALBUMIN 4.3 3.8  --   --   --   --   AST 28 27  --   --   --   --   ALT 16 16  --   --   --   --  ALKPHOS 86 73  --   --   --   --   BILITOT 0.3 0.3  --   --   --   --   GFRNONAA 50* 43*   < > 32* 36* 41*  ANIONGAP 16* 11   < > 9 9 12    < > = values in this interval not displayed.     Hematology Recent Labs  Lab 08/07/20 0055 08/08/20 0202 08/09/20 0155  WBC 8.8 8.0 7.8  RBC 4.23 4.15 4.19  HGB 11.6* 11.6* 11.5*  HCT 34.8* 33.5* 34.1*  MCV 82.3 80.7 81.4  MCH 27.4 28.0 27.4  MCHC 33.3 34.6 33.7  RDW 14.1 14.0 14.0  PLT 192 201 198    BNP Recent Labs  Lab 08/03/20 2052  BNP 278.0*     DDimer No results for input(s): DDIMER in the last 168 hours.   Radiology    No results found.  Cardiac Studies   Cath 08/07/20:   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 This was a small vessel.  Ost LAD to Prox LAD lesion is 70% stenosed. A drug-eluting stent was successfully placed using a STENT RESOLUTE ONYX 4.0X15 and optimized with intravascular ultrasound.  Mid LAD lesion is 25% stenosed.  Dist LAD lesion is 10% stenosed.  Post intervention, there is a 0% residual stenosis.  LV end diastolic pressure is normal. LVEDP 8 mm Hg.  There is no aortic valve  stenosis.  A drug-eluting stent was successfully placed using a STENT RESOLUTE ONYX 4.0X15.  Due to severe tortuosity at the proximal LAD, it was difficult to cross into the mid LAD. Super cross catheter would probably be needed in the future if access to the LAD was required.  She also had some tortuosity in the right subclavian. The right radial pulse was faint although by ultrasound, it appeared the right radial was patent. The ulnar artery appeared to be larger which is why we chose this approach. There was some vasospasm in the ulnar which was treated with additional verapamil at the end of the case.  Diagnostic Dominance: Right    Intervention     Echo 08/04/20 1. Left ventricular ejection fraction, by estimation, is 40 to 45%. The  left ventricle has mildly decreased function. The left ventricle  demonstrates global hypokinesis. There is moderate concentric left  ventricular hypertrophy. Left ventricular  diastolic parameters are indeterminate.  2. Right ventricular systolic function is normal. The right ventricular  size is normal.  3. Left atrial size was mildly dilated.  4. The mitral valve is grossly normal. Trivial mitral valve  regurgitation. Moderate to severe mitral annular calcification.  5. The aortic valve is tricuspid. Aortic valve regurgitation is not  visualized. No aortic stenosis is present.  6. The inferior vena cava is normal in size with greater than 50%  respiratory variability, suggesting right atrial pressure of 3 mmHg.   Patient Profile     75 y.o. female history of uncontrolled hypertension and diabetes mellitus presented to [facility] with hypertensive emergency associated with flash pulmonary edema.  She was diuresed with IV Lasix. Her EF was reduced and her troponin elevated.  EKG with T wave inversion in anterior lead.  Transferred to [facility] for cardiac cath.  Assessment & Plan    1. NSTEMI/CAD - Hs-  troponin peaked at 304.  Cardiac cath showed 70% ostial to proximal LAD stenosis s/p DES.  Continue dual antiplatelet therapy with aspirin and Brilinta for 1 year.  Continue beta-blocker, Imdur and statin.  2. ICM - EF 40-45%. Euvolemic -Continue Coreg 6.25 mg twice daily (dose reduced yesterday secondary to heart rate in 50s and dizzy/nausea) -Continue Entresto and Farxiga  3.  Hypertensive urgency -Blood pressure improving -Continue Coreg 6.25 mg twice daily (not uptitrate secondary to suspicious for symptomatic bradycardia) -Uptitrate Entresto in outpatient setting -Keep log of blood pressure  4.  Hyperlipidemia -08/04/2020: Cholesterol 266; HDL 70; LDL Cholesterol 156; Triglycerides 198; VLDL 40  - Continue Lipitor - LDL goal less than 70 - lipid panel and lfts in 6-8 weeks  5. DM - Per primary team  Dr. Anne Fu to see. Patient will be followed in [place] [facility]. Follow up has been made.    For questions or updates, please contact [facility] Please consult [email or web address] for contact info under        SignedManson Passey, PA  08/09/2020, 9:39 AM    Agree with above.  Awaiting discharge.  See my note from yesterday as well.  Donato Schultz, MD

## 2020-08-09 NOTE — Progress Notes (Signed)
CARDIAC REHAB PHASE I   Offered to walk with pt. Pt declines at this time, wanting to eat breakfast. Reinforced importance of ASA and Brilinta. Reviewed site care, restrictions, and exercise guidelines. Pt states denies any dizziness or nausea. Encouraged continued ambulation. Pt hopeful  For d/c today. Will refer to CRP II [place].  [id number] Reynold Bowen, RN BSN 08/09/2020 9:38 AM

## 2020-08-16 ENCOUNTER — Telehealth: Payer: Self-pay

## 2020-08-16 NOTE — Telephone Encounter (Signed)
**Note De-Identified Cassandra Johnson Obfuscation** Letter received from [employer] stating that they have approved the pt for Entresto assistance until 06/01/2021 Patient ID: [id number]  The letter states that they have notified the pt of this approval as well.

## 2020-08-20 NOTE — Progress Notes (Signed)
Cardiology Office Note  Date: 08/21/2020   ID: Cassandra Johnson, Cassandra Johnson 1945/10/16, MRN [id number]  PCP:  Joaquin Courts, DO  Cardiologist:  Nona Dell, MD Electrophysiologist:  None   Chief Complaint: Hospital follow-up NSTEMI  History of Present Illness: Cassandra Johnson is a 75 y.o. female with a history of unstable angina, HFrEF, CKD stage II-III, DM 2 new onset with A1c 6.9.  Recent hospital admission on 08/03/2020 with congestive heart failure, hypertensive emergency, hypokalemia, acute respiratory failure with hypoxia, AKI, NSTEMI, HLD, DM2.  Presented to [facility] with shortness of breath, cough.  Hypoxic respiratory failure chest x-ray demonstrated CHF.  EKG without ischemic changes.  Troponins: [phone].  Cardiology was consulted and patient was started on diuresis.  EKG with anterolateral T wave inversions ST-T wave changes and new diagnosis of type 2 diabetes.  Transferred to [facility] on 08/07/2019 was subsequent catheterization.  Cath report with RPDA lesion 80%.  Ostial LAD to proximal LAD 70% with successful DES placement.  Mid LAD 25%, distal LAD 10%.  Discharge medications included aspirin, atorvastatin, carvedilol, Imdur, Entresto, Brilinta.  .  She was started on glipizide for new diagnosis of type 2 diabetes with A1c of 6.9%  She is here for hospital follow-up status post recent NSTEMI, congestive heart failure, new onset diabetes.  She denies any chest pain, pressure, tightness, neck, arm, back pain.  Denies any shortness of breath or dyspnea on exertion.  Denies any palpitations or arrhythmias.  No CVA or TIA-like symptoms, no PND or orthopnea, no bleeding issues.  Blood pressure is elevated on arrival at 160/90.  Recheck on left arm was 148/90.  Denies any claudication-like symptoms, DVT or PE-like symptoms, lower extremity edema.   Past Medical History:  Diagnosis Date  . Hypertension     Past Surgical History:  Procedure Laterality Date  .  CORONARY STENT INTERVENTION N/A 08/07/2020   Procedure: CORONARY STENT INTERVENTION;  Surgeon: Corky Crafts, MD;  Location: [facility] INVASIVE CV LAB;  Service: Cardiovascular;  Laterality: N/A;  . INTRAVASCULAR ULTRASOUND/IVUS N/A 08/07/2020   Procedure: Intravascular Ultrasound/IVUS;  Surgeon: Corky Crafts, MD;  Location: [facility] INVASIVE CV LAB;  Service: Cardiovascular;  Laterality: N/A;  . LEFT HEART CATH AND CORONARY ANGIOGRAPHY N/A 08/07/2020   Procedure: LEFT HEART CATH AND CORONARY ANGIOGRAPHY;  Surgeon: Corky Crafts, MD;  Location: [facility] INVASIVE CV LAB;  Service: Cardiovascular;  Laterality: N/A;  . No prior surgeries      Current Outpatient Medications  Medication Sig Dispense Refill  . aspirin 81 MG chewable tablet Chew 1 tablet (81 mg total) by mouth daily.    Marland Kitchen atorvastatin (LIPITOR) 40 MG tablet Take 1 tablet (40 mg total) by mouth daily at 6 PM. 30 tablet 3  . carvedilol (COREG) 6.25 MG tablet Take 1 tablet (6.25 mg total) by mouth 2 (two) times daily with a meal. 60 tablet 3  . dapagliflozin propanediol (FARXIGA) 10 MG TABS tablet Take 1 tablet (10 mg total) by mouth daily. 30 tablet 3  . isosorbide mononitrate (IMDUR) 30 MG 24 hr tablet Take 1 tablet (30 mg total) by mouth daily. 30 tablet 3  . polyethylene glycol (MIRALAX / GLYCOLAX) 17 g packet Take 17 g by mouth daily as needed for mild constipation. 14 each 0  . sacubitril-valsartan (ENTRESTO) 24-26 MG Take 1 tablet by mouth 2 (two) times daily. 60 tablet 3  . ticagrelor (BRILINTA) 90 MG TABS tablet Take 1 tablet (90 mg total) by mouth  2 (two) times daily. 60 tablet 3   No current facility-administered medications for this visit.   Allergies:  Patient has no known allergies.   Social History: The patient  reports that she has never smoked. She has never used smokeless tobacco. She reports previous alcohol use. She reports previous drug use.   Family History: The patient's family history includes Diabetes in her  father; Unexplained death in her mother.   ROS:  Please see the history of present illness. Otherwise, complete review of systems is positive for none.  All other systems are reviewed and negative.   Physical Exam: VS:  BP (!) 160/90   Pulse 60   Ht 5\' 7"  (1.702 m)   Wt 167 lb (75.8 kg)   SpO2 99%   BMI 26.16 kg/m , BMI Body mass index is 26.16 kg/m.  Wt Readings from Last 3 Encounters:  08/21/20 167 lb (75.8 kg)  08/09/20 165 lb 4.8 oz (75 kg)    General: Patient appears comfortable at rest. Neck: Supple, no elevated JVP or carotid bruits, no thyromegaly. Lungs: Clear to auscultation, nonlabored breathing at rest. Cardiac: Regular rate and rhythm, no S3 or significant systolic murmur, no pericardial rub. Extremities: No pitting edema, distal pulses 2+. Skin: Warm and dry. Musculoskeletal: No kyphosis. Neuropsychiatric: Alert and oriented x3, affect grossly appropriate.  ECG:  EKG August 08, 2020 sinus bradycardia rate of 57, LVH, U waves, nonspecific ST and T wave abnormality.  Prolonged QT.  Recent Labwork: 08/03/2020: B Natriuretic Peptide 278.0; TSH 3.396 08/04/2020: ALT 16; AST 27 08/05/2020: Magnesium 1.8 08/08/2020: BUN 21; Creatinine, Ser 1.36; Potassium 3.7; Sodium 139 08/09/2020: Hemoglobin 11.5; Platelets 198     Component Value Date/Time   CHOL 266 (H) 08/04/2020 0519   TRIG 198 (H) 08/04/2020 0519   HDL 70 08/04/2020 0519   CHOLHDL 3.8 08/04/2020 0519   VLDL 40 08/04/2020 0519   LDLCALC 156 (H) 08/04/2020 0519    Other Studies Reviewed Today:   08/07/2020 CORONARY STENT INTERVENTION  Intravascular Ultrasound/IVUS  LEFT HEART CATH AND CORONARY ANGIOGRAPHY    Conclusion    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 This was a small vessel.  Ost LAD to Prox LAD lesion is 70% stenosed. A drug-eluting stent was successfully placed using a STENT RESOLUTE ONYX 4.0X15 and optimized with intravascular ultrasound.  Mid LAD lesion is 25% stenosed.  Dist LAD lesion is 10%  stenosed.  Post intervention, there is a 0% residual stenosis.  LV end diastolic pressure is normal. LVEDP 8 mm Hg.  There is no aortic valve stenosis.  A drug-eluting stent was successfully placed using a STENT RESOLUTE ONYX 4.0X15.   Due to severe tortuosity at the proximal LAD, it was difficult to cross into the mid LAD.  Super cross catheter would probably be needed in the future if access to the LAD was required.  She also had some tortuosity in the right subclavian.  The right radial pulse was faint although by ultrasound, it appeared the right radial was patent.  The ulnar artery appeared to be larger which is why we chose this approach.  There was some vasospasm in the ulnar which was treated with additional verapamil at the end of the case.  Diagnostic Dominance: Right    Intervention          Echocardiogram 08/04/2020  1. Left ventricular ejection fraction, by estimation, is 40 to 45%. The  left ventricle has mildly decreased function. The left ventricle  demonstrates global hypokinesis. There  is moderate concentric left  ventricular hypertrophy. Left ventricular  diastolic parameters are indeterminate.  2. Right ventricular systolic function is normal. The right ventricular  size is normal.  3. Left atrial size was mildly dilated.  4. The mitral valve is grossly normal. Trivial mitral valve  regurgitation. Moderate to severe mitral annular calcification.  5. The aortic valve is tricuspid. Aortic valve regurgitation is not  visualized. No aortic stenosis is present.  6. The inferior vena cava is normal in size with greater than 50%  respiratory variability, suggesting right atrial pressure of 3 mmHg.   Comparison(s): No prior Echocardiogram.   Conclusion(s)/Recommendation(s): EF 40-45% with global hypokinesis,  moderate LVH. No significant valve disease.   Assessment and Plan:  1. NSTEMI (non-ST elevated myocardial infarction) (HCC)   2. HFrEF  (heart failure with reduced ejection fraction) (HCC)   3. Acute respiratory failure with hypoxia (HCC)   4. Essential hypertension   5. Mixed hyperlipidemia   6. Type 2 diabetes mellitus without complication, without long-term current use of insulin (HCC)    1. NSTEMI (non-ST elevated myocardial infarction) [facility]) Status post recent NSTEMI.  She denies any anginal or exertional symptoms.  Continue aspirin 81 mg daily.  Continue Brilinta 90 mg p.o. twice daily.  Continue Imdur 30 mg daily.  2. HFrEF (heart failure with reduced ejection fraction) (HCC) Recent echo showed EF 40 to 45% with global hypokinesis and moderate LVH.  Continue Coreg 6.25 mg p.o. twice daily.  Continue Imdur 30 mg daily.  Continue Entresto 24/26 mg p.o. twice daily.  In 3 months.  She will need a follow-up echocardiogram to reassess LV function in approximately 3 months.  3. Acute respiratory failure with hypoxia (HCC) No evidence of dyspnea or shortness of breath.  Oxygen saturation on room air is 99%.  4. Essential hypertension Blood pressure elevated on arrival at 160/90.  Daughter states blood pressures at home have been in the 160s systolic also.  Start amlodipine 2.5 mg daily.  May need to titrate up to 5 mg.  Advised to start checking blood pressure at home goal is 130/80 or less.  Continue carvedilol 6.25 mg p.o. twice daily.  We will follow-up in 1 month for recheck of blood pressures.  5. Mixed hyperlipidemia Continue atorvastatin 40 mg p.o. daily.  Recent lipid panel on 08/04/2020 demonstrated cholesterol 266, triglycerides 198, HDL 70, LDL 156.  We will need to get repeat FLP's and LFTs in 6 to 8 weeks.  6. Type 2 diabetes mellitus without complication, without long-term current use of insulin (HCC) Recent discovery of new onset diabetes during hospital stay with hemoglobin A1c of 6.9%.  She was started on Farxiga 10 mg daily.  To follow with PCP for management.  Medication Adjustments/Labs and Tests  Ordered: Current medicines are reviewed at length with the patient today.  Concerns regarding medicines are outlined above.   Disposition: Follow-up with Dr. Diona Browner or APP 1 month.  Signed, Rennis Harding, NP 08/21/2020 9:23 AM    [facility] at [facility] [address] [place], [place], [place] [address] Phone: [phone]; Fax: [phone]

## 2020-08-21 ENCOUNTER — Ambulatory Visit (INDEPENDENT_AMBULATORY_CARE_PROVIDER_SITE_OTHER): Payer: Medicare Other | Admitting: Family Medicine

## 2020-08-21 ENCOUNTER — Telehealth (HOSPITAL_COMMUNITY): Payer: Self-pay

## 2020-08-21 ENCOUNTER — Encounter: Payer: Self-pay | Admitting: Family Medicine

## 2020-08-21 VITALS — BP 142/90 | HR 60 | Ht 67.0 in | Wt 167.0 lb

## 2020-08-21 DIAGNOSIS — I5020 Unspecified systolic (congestive) heart failure: Secondary | ICD-10-CM | POA: Diagnosis not present

## 2020-08-21 DIAGNOSIS — E119 Type 2 diabetes mellitus without complications: Secondary | ICD-10-CM

## 2020-08-21 DIAGNOSIS — J9601 Acute respiratory failure with hypoxia: Secondary | ICD-10-CM | POA: Diagnosis not present

## 2020-08-21 DIAGNOSIS — I214 Non-ST elevation (NSTEMI) myocardial infarction: Secondary | ICD-10-CM

## 2020-08-21 DIAGNOSIS — E782 Mixed hyperlipidemia: Secondary | ICD-10-CM

## 2020-08-21 DIAGNOSIS — I10 Essential (primary) hypertension: Secondary | ICD-10-CM | POA: Diagnosis not present

## 2020-08-21 MED ORDER — AMLODIPINE BESYLATE 5 MG PO TABS: 2.5000 mg | ORAL_TABLET | Freq: Every day | ORAL | 1 refills | Status: DC

## 2020-08-21 NOTE — Patient Instructions (Signed)
Your physician recommends that you schedule a follow-up appointment in: 1 MONTH WITH Cassandra Polio, NP  Your physician has recommended you make the following change in your medication:   START AMLODIPINE 5 MG - TAKE 1/2 TABLET DAILY   Thank you for choosing [facility]!!

## 2020-08-21 NOTE — Telephone Encounter (Signed)
Contacted [employer] to follow up on patient assistance application submitted for Brilinta and Farxiga. She has been approved from 08/17/20 - 11/15/20. She will need to apply for Medicaid. To continue enrollment in this program, they will require a denial letter from [facility].   Contacted [employer] to follow up on patient assistance application submitted for [employer]. She has been approved from 08/14/20-06/01/21.  Called West Pugh (daughter) to provide her with the information. They just received the Entresto in the mail yesterday and she will work on submitting the Medicaid forms this week.

## 2020-09-20 NOTE — Progress Notes (Signed)
Cardiology Office Note  Date: 09/21/2020   ID: Cassandra, Johnson Apr 24, 1946, MRN [id number]  PCP:  Joaquin Courts, DO  Cardiologist:  Nona Dell, MD Electrophysiologist:  None   Chief Complaint: Hospital follow-up NSTEMI  History of Present Illness: Cassandra Johnson is a 75 y.o. female with a history of unstable angina, HFrEF, CKD stage II-III, DM 2 new onset with A1c 6.9.  Recent hospital admission on 08/03/2020 with congestive heart failure, hypertensive emergency, hypokalemia, acute respiratory failure with hypoxia, AKI, NSTEMI, HLD, DM2.  Presented to [facility] with shortness of breath, cough.  Hypoxic respiratory failure chest x-ray demonstrated CHF.  EKG without ischemic changes.  Troponins: [phone].  Cardiology was consulted and patient was started on diuresis.  EKG with anterolateral T wave inversions ST-T wave changes and new diagnosis of type 2 diabetes.  Transferred to [facility] on 08/07/2019 with subsequent catheterization.  Cath report with RPDA lesion 80%.  Ostial LAD to proximal LAD 70% with successful DES placement.  Mid LAD 25%, distal LAD 10%.  Discharge medications included aspirin, atorvastatin, carvedilol, Imdur, Entresto, Brilinta.  .  She was started on glipizide for new diagnosis of type 2 diabetes with A1c of 6.9%  She was last here for hospital follow-up status post recent NSTEMI, congestive heart failure, new onset diabetes.  She denied any chest pain, pressure, tightness, neck, arm, back pain.  Denied any shortness of breath or dyspnea on exertion. Denied any palpitations or arrhythmias.  No CVA or TIA-like symptoms, no PND or orthopnea, no bleeding issues.  Blood pressure was elevated on arrival at 160/90.  Recheck on left arm was 148/90.  Denied any claudication-like symptoms, DVT or PE-like symptoms, lower extremity edema.   She is here today for 1 month follow-up.  Blood pressure appears to still be ranging in the systolic 140s.  She  denies any issues with anginal or exertional symptoms, orthostatic symptoms, CVA or TIA-like symptoms, PND, orthopnea.  No bleeding or DVT/PE-like symptoms.  No lower extremity edema.   Past Medical History:  Diagnosis Date  . Hypertension     Past Surgical History:  Procedure Laterality Date  . CORONARY STENT INTERVENTION N/A 08/07/2020   Procedure: CORONARY STENT INTERVENTION;  Surgeon: Corky Crafts, MD;  Location: [facility] INVASIVE CV LAB;  Service: Cardiovascular;  Laterality: N/A;  . INTRAVASCULAR ULTRASOUND/IVUS N/A 08/07/2020   Procedure: Intravascular Ultrasound/IVUS;  Surgeon: Corky Crafts, MD;  Location: [facility] INVASIVE CV LAB;  Service: Cardiovascular;  Laterality: N/A;  . LEFT HEART CATH AND CORONARY ANGIOGRAPHY N/A 08/07/2020   Procedure: LEFT HEART CATH AND CORONARY ANGIOGRAPHY;  Surgeon: Corky Crafts, MD;  Location: [facility] INVASIVE CV LAB;  Service: Cardiovascular;  Laterality: N/A;  . No prior surgeries      Current Outpatient Medications  Medication Sig Dispense Refill  . amLODipine (NORVASC) 5 MG tablet Take 0.5 tablets (2.5 mg total) by mouth daily. 45 tablet 1  . aspirin 81 MG chewable tablet Chew 1 tablet (81 mg total) by mouth daily.    Marland Kitchen atorvastatin (LIPITOR) 40 MG tablet TAKE 1 TABLET (40 MG TOTAL) BY MOUTH DAILY AT 6 PM. (Patient taking differently: Take 1 tablet by mouth daily. at 6pm) 30 tablet 3  . carvedilol (COREG) 6.25 MG tablet Take 1 tablet (6.25 mg total) by mouth 2 (two) times daily with a meal. 60 tablet 3  . dapagliflozin propanediol (FARXIGA) 10 MG TABS tablet Take 1 tablet (10 mg total) by mouth daily. 30 tablet  3  . isosorbide mononitrate (IMDUR) 30 MG 24 hr tablet Take 1 tablet (30 mg total) by mouth daily. 30 tablet 3  . polyethylene glycol (MIRALAX / GLYCOLAX) 17 g packet Take 17 g by mouth daily as needed for mild constipation. 14 each 0  . polyethylene glycol powder (GLYCOLAX/MIRALAX) 17 GM/SCOOP powder DISSOLVE 1 CAPFUL IN WATER AND TAKE  BY MOUTH DAILY AS NEEDED FOR MILD CONSTIPATION. 238 g 0  . sacubitril-valsartan (ENTRESTO) 24-26 MG Take 1 tablet by mouth 2 (two) times daily. 60 tablet 3  . ticagrelor (BRILINTA) 90 MG TABS tablet TAKE 1 TABLET (90 MG TOTAL) BY MOUTH TWO TIMES DAILY. 60 tablet 3   No current facility-administered medications for this visit.   Allergies:  Patient has no known allergies.   Social History: The patient  reports that she has never smoked. She has never used smokeless tobacco. She reports previous alcohol use. She reports previous drug use.   Family History: The patient's family history includes Diabetes in her father; Unexplained death in her mother.   ROS:  Please see the history of present illness. Otherwise, complete review of systems is positive for none.  All other systems are reviewed and negative.   Physical Exam: VS:  BP (!) 148/82   Pulse 68   Ht 5\' 7"  (1.702 m)   Wt 159 lb (72.1 kg)   SpO2 98%   BMI 24.90 kg/m , BMI Body mass index is 24.9 kg/m.  Wt Readings from Last 3 Encounters:  09/21/20 159 lb (72.1 kg)  08/21/20 167 lb (75.8 kg)  08/09/20 165 lb 4.8 oz (75 kg)    General: Patient appears comfortable at rest. Neck: Supple, no elevated JVP or carotid bruits, no thyromegaly. Lungs: Clear to auscultation, nonlabored breathing at rest. Cardiac: Regular rate and rhythm, no S3 or significant systolic murmur, no pericardial rub. Extremities: No pitting edema, distal pulses 2+. Skin: Warm and dry. Musculoskeletal: No kyphosis. Neuropsychiatric: Alert and oriented x3, affect grossly appropriate.  ECG:  EKG August 08, 2020 sinus bradycardia rate of 57, LVH, U waves, nonspecific ST and T wave abnormality.  Prolonged QT.  Recent Labwork: 08/03/2020: B Natriuretic Peptide 278.0; TSH 3.396 08/04/2020: ALT 16; AST 27 08/05/2020: Magnesium 1.8 08/08/2020: BUN 21; Creatinine, Ser 1.36; Potassium 3.7; Sodium 139 08/09/2020: Hemoglobin 11.5; Platelets 198     Component Value Date/Time    CHOL 266 (H) 08/04/2020 0519   TRIG 198 (H) 08/04/2020 0519   HDL 70 08/04/2020 0519   CHOLHDL 3.8 08/04/2020 0519   VLDL 40 08/04/2020 0519   LDLCALC 156 (H) 08/04/2020 0519    Other Studies Reviewed Today:   08/07/2020 CORONARY STENT INTERVENTION  Intravascular Ultrasound/IVUS  LEFT HEART CATH AND CORONARY ANGIOGRAPHY    Conclusion    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 This was a small vessel.  Ost LAD to Prox LAD lesion is 70% stenosed. A drug-eluting stent was successfully placed using a STENT RESOLUTE ONYX 4.0X15 and optimized with intravascular ultrasound.  Mid LAD lesion is 25% stenosed.  Dist LAD lesion is 10% stenosed.  Post intervention, there is a 0% residual stenosis.  LV end diastolic pressure is normal. LVEDP 8 mm Hg.  There is no aortic valve stenosis.  A drug-eluting stent was successfully placed using a STENT RESOLUTE ONYX 4.0X15.   Due to severe tortuosity at the proximal LAD, it was difficult to cross into the mid LAD.  Super cross catheter would probably be needed in the future if access to  the LAD was required.  She also had some tortuosity in the right subclavian.  The right radial pulse was faint although by ultrasound, it appeared the right radial was patent.  The ulnar artery appeared to be larger which is why we chose this approach.  There was some vasospasm in the ulnar which was treated with additional verapamil at the end of the case.  Diagnostic Dominance: Right    Intervention          Echocardiogram 08/04/2020  1. Left ventricular ejection fraction, by estimation, is 40 to 45%. The  left ventricle has mildly decreased function. The left ventricle  demonstrates global hypokinesis. There is moderate concentric left  ventricular hypertrophy. Left ventricular  diastolic parameters are indeterminate.  2. Right ventricular systolic function is normal. The right ventricular  size is normal.  3. Left atrial size was mildly dilated.   4. The mitral valve is grossly normal. Trivial mitral valve  regurgitation. Moderate to severe mitral annular calcification.  5. The aortic valve is tricuspid. Aortic valve regurgitation is not  visualized. No aortic stenosis is present.  6. The inferior vena cava is normal in size with greater than 50%  respiratory variability, suggesting right atrial pressure of 3 mmHg.   Comparison(s): No prior Echocardiogram.   Conclusion(s)/Recommendation(s): EF 40-45% with global hypokinesis,  moderate LVH. No significant valve disease.   Assessment and Plan:  1. NSTEMI (non-ST elevated myocardial infarction) (HCC)   2. HFrEF (heart failure with reduced ejection fraction) (HCC)   3. Essential hypertension   4. Mixed hyperlipidemia   5. Type 2 diabetes mellitus without complication, without long-term current use of insulin (HCC)    1. NSTEMI (non-ST elevated myocardial infarction) [facility](HCC) Status post recent NSTEMI.  She denies any anginal or exertional symptoms.  Continue aspirin 81 mg daily.  Continue Brilinta 90 mg p.o. twice daily.  Continue Imdur 30 mg daily.  2. HFrEF (heart failure with reduced ejection fraction) (HCC) Recent echo showed EF 40 to 45% with global hypokinesis and moderate LVH.  Continue Coreg 6.25 mg p.o. twice daily.  Continue Imdur 30 mg daily.  Continue Entresto 24/26 mg p.o. twice daily.  In 3 months she will need a follow-up echocardiogram to reassess LV function in approximately 3 months.  3. Acute respiratory failure with hypoxia (HCC) No evidence of dyspnea or shortness of breath.  Oxygen saturation on room air is 98%.  4. Essential hypertension Blood pressure remains elevated today on follow-up.  BP 148/82.  She states her systolic has been running in the low  140s.  Increase amlodipine to 5 mg p.o. daily.  Continue checking blood pressure at home goal is 130/80 or less.  Please call if sustained blood pressures at or above 130/80.  Continue carvedilol 6.25 mg p.o.  twice daily.    5. Mixed hyperlipidemia Continue atorvastatin 40 mg p.o. daily.  Recent lipid panel on 08/04/2020 demonstrated cholesterol 266, triglycerides 198, HDL 70, LDL 156.  We will need to get repeat FLP's and LFTs in 6 to 8 weeks.  6. Type 2 diabetes mellitus without complication, without long-term current use of insulin (HCC) Recent discovery of new onset diabetes during hospital stay with hemoglobin A1c of 6.9%.  She was started on Farxiga 10 mg daily.  To follow with PCP for management.  Medication Adjustments/Labs and Tests Ordered: Current medicines are reviewed at length with the patient today.  Concerns regarding medicines are outlined above.   Disposition: Follow-up with Dr. Diona BrownerMcDowell or APP 3  months Signed, Rennis Harding, NP 09/21/2020 10:28 AM    [facility] at [facility] [address] [place], [place], [place] [address] Phone: [phone]; Fax: [phone]

## 2020-09-21 ENCOUNTER — Encounter: Payer: Self-pay | Admitting: Family Medicine

## 2020-09-21 ENCOUNTER — Ambulatory Visit (INDEPENDENT_AMBULATORY_CARE_PROVIDER_SITE_OTHER): Payer: Medicare Other | Admitting: Family Medicine

## 2020-09-21 VITALS — BP 148/82 | HR 68 | Ht 67.0 in | Wt 159.0 lb

## 2020-09-21 DIAGNOSIS — E119 Type 2 diabetes mellitus without complications: Secondary | ICD-10-CM

## 2020-09-21 DIAGNOSIS — I214 Non-ST elevation (NSTEMI) myocardial infarction: Secondary | ICD-10-CM | POA: Diagnosis not present

## 2020-09-21 DIAGNOSIS — I5020 Unspecified systolic (congestive) heart failure: Secondary | ICD-10-CM | POA: Diagnosis not present

## 2020-09-21 DIAGNOSIS — E782 Mixed hyperlipidemia: Secondary | ICD-10-CM

## 2020-09-21 DIAGNOSIS — I10 Essential (primary) hypertension: Secondary | ICD-10-CM | POA: Diagnosis not present

## 2020-09-21 MED ORDER — AMLODIPINE BESYLATE 5 MG PO TABS: 5.0000 mg | ORAL_TABLET | Freq: Every day | ORAL | 3 refills | Status: DC

## 2020-09-21 NOTE — Patient Instructions (Signed)
Medication Instructions:   Increase Amlodipine to 5mg  daily.  Continue all other medications.    Labwork: none  Testing/Procedures: none  Follow-Up: 3 months   Any Other Special Instructions Will Be Listed Below (If Applicable).  If you need a refill on your cardiac medications before your next appointment, please call your pharmacy.

## 2020-10-03 ENCOUNTER — Other Ambulatory Visit: Payer: Self-pay | Admitting: *Deleted

## 2020-10-03 ENCOUNTER — Encounter: Payer: Self-pay | Admitting: *Deleted

## 2020-10-03 DIAGNOSIS — E782 Mixed hyperlipidemia: Secondary | ICD-10-CM

## 2020-10-22 ENCOUNTER — Telehealth: Payer: Self-pay | Admitting: *Deleted

## 2020-10-22 NOTE — Telephone Encounter (Signed)
Left message to return call 

## 2020-10-22 NOTE — Telephone Encounter (Signed)
-----   Message from Lesle Chris, LPN sent at 10/05/1831 11:00 AM EDT ----- Regarding: echo - due 6/22 - call patient

## 2020-10-22 NOTE — Telephone Encounter (Signed)
Per AQ last OV note from 09/21/2020 -  2. HFrEF (heart failure with reduced ejection fraction) (HCC) Recent echo showed EF 40 to 45% with global hypokinesis and moderate LVH.  Continue Coreg 6.25 mg p.o. twice daily.  Continue Imdur 30 mg daily.  Continue Entresto 24/26 mg p.o. twice daily.  In 3 months she will need a follow-up echocardiogram to reassess LV function in approximately 3 months.

## 2020-11-02 NOTE — Telephone Encounter (Signed)
lmtcb x2 

## 2020-11-08 ENCOUNTER — Encounter: Payer: Self-pay | Admitting: *Deleted

## 2020-11-09 ENCOUNTER — Encounter: Payer: Self-pay | Admitting: Family Medicine

## 2020-11-09 ENCOUNTER — Ambulatory Visit (INDEPENDENT_AMBULATORY_CARE_PROVIDER_SITE_OTHER): Payer: Medicare Other | Admitting: Family Medicine

## 2020-11-09 ENCOUNTER — Other Ambulatory Visit: Payer: Self-pay

## 2020-11-09 VITALS — BP 140/90 | HR 74 | Ht 67.0 in | Wt 159.2 lb

## 2020-11-09 DIAGNOSIS — J9601 Acute respiratory failure with hypoxia: Secondary | ICD-10-CM

## 2020-11-09 DIAGNOSIS — Z79899 Other long term (current) drug therapy: Secondary | ICD-10-CM

## 2020-11-09 DIAGNOSIS — I5020 Unspecified systolic (congestive) heart failure: Secondary | ICD-10-CM

## 2020-11-09 DIAGNOSIS — E119 Type 2 diabetes mellitus without complications: Secondary | ICD-10-CM

## 2020-11-09 DIAGNOSIS — I214 Non-ST elevation (NSTEMI) myocardial infarction: Secondary | ICD-10-CM | POA: Diagnosis not present

## 2020-11-09 DIAGNOSIS — I10 Essential (primary) hypertension: Secondary | ICD-10-CM

## 2020-11-09 DIAGNOSIS — E782 Mixed hyperlipidemia: Secondary | ICD-10-CM

## 2020-11-09 MED ORDER — ASPIRIN EC 81 MG PO TBEC: 81.0000 mg | DELAYED_RELEASE_TABLET | Freq: Every day | ORAL | 3 refills | Status: AC

## 2020-11-09 NOTE — Progress Notes (Addendum)
Cardiology Office Note  Date: 11/09/2020   ID: Cassandra Johnson, Cassandra Johnson [birth year], MRN [id number]  PCP:  Joaquin Courts, DO  Cardiologist:  Nona Dell, MD Electrophysiologist:  None   Chief Complaint: Hospital follow-up GI bleed  History of Present Illness: Cassandra Johnson is a 75 y.o. female with a history of unstable angina, HFrEF, CKD stage II-III, DM 2 new onset with A1c 6.9.  Recent hospital admission on 08/03/2020 with congestive heart failure, hypertensive emergency, hypokalemia, acute respiratory failure with hypoxia, AKI, NSTEMI, HLD, DM2.  Presented to [facility] with shortness of breath, cough.  Hypoxic respiratory failure chest x-ray demonstrated CHF.  EKG without ischemic changes.  Troponins: [phone].  Cardiology was consulted and patient was started on diuresis.  EKG with anterolateral T wave inversions ST-T wave changes and new diagnosis of type 2 diabetes.  Transferred to [facility] on 08/07/2019 with subsequent catheterization.  Cath report with RPDA lesion 80%.  Ostial LAD to proximal LAD 70% with successful DES placement.  Mid LAD 25%, distal LAD 10%.  Discharge medications included aspirin, atorvastatin, carvedilol, Imdur, Entresto, Brilinta.  .  She was started on glipizide for new diagnosis of type 2 diabetes with A1c of 6.9%  She was last here for hospital follow-up status post recent NSTEMI, congestive heart failure, new onset diabetes.  She denied any chest pain, pressure, tightness, neck, arm, back pain.  Denied any shortness of breath or dyspnea on exertion. Denied any palpitations or arrhythmias.  No CVA or TIA-like symptoms, no PND or orthopnea, no bleeding issues.  Blood pressure was elevated on arrival at 160/90.  Recheck on left arm was 148/90.  Denied any claudication-like symptoms, DVT or PE-like symptoms, lower extremity edema.   She is here for follow-up status post recent GI bleed and subsequent EGD.  She was seen at a hospital in  [place].  Her aspirin and Brilinta were stopped during that hospitalization.  The bleeding has resolved.  She had some ulcerations/erosions per mother's statement.  We do not have those records which were requested prior to her follow-up here today.  Nursing staff is again requesting those records today.   At discharge she was started on Plavix only.  She subsequently saw her PCP who restarted her Plavix but did not stop the Brilinta no nor did he restart her aspirin.  She denies any subsequent bleeding.  She denies any other issues i.e. angina, dyspnea, orthostatic symptoms, CVA or TIA-like symptoms, claudication-like symptoms, DVT or PE-like symptoms, lower extremity edema.    Past Medical History:  Diagnosis Date   Hypertension     Past Surgical History:  Procedure Laterality Date   CORONARY STENT INTERVENTION N/A 08/07/2020   Procedure: CORONARY STENT INTERVENTION;  Surgeon: Corky Crafts, MD;  Location: [facility] INVASIVE CV LAB;  Service: Cardiovascular;  Laterality: N/A;   INTRAVASCULAR ULTRASOUND/IVUS N/A 08/07/2020   Procedure: Intravascular Ultrasound/IVUS;  Surgeon: Corky Crafts, MD;  Location: [facility] INVASIVE CV LAB;  Service: Cardiovascular;  Laterality: N/A;   LEFT HEART CATH AND CORONARY ANGIOGRAPHY N/A 08/07/2020   Procedure: LEFT HEART CATH AND CORONARY ANGIOGRAPHY;  Surgeon: Corky Crafts, MD;  Location: [facility] INVASIVE CV LAB;  Service: Cardiovascular;  Laterality: N/A;   No prior surgeries      Current Outpatient Medications  Medication Sig Dispense Refill   amLODipine (NORVASC) 5 MG tablet Take 1 tablet (5 mg total) by mouth daily. 90 tablet 3   aspirin EC 81 MG tablet Take 1  tablet (81 mg total) by mouth daily. Swallow whole. 90 tablet 3   atorvastatin (LIPITOR) 40 MG tablet TAKE 1 TABLET (40 MG TOTAL) BY MOUTH DAILY AT 6 PM. 30 tablet 3   carvedilol (COREG) 6.25 MG tablet Take 1 tablet (6.25 mg total) by mouth 2 (two) times daily with a meal. 60 tablet 3    dapagliflozin propanediol (FARXIGA) 10 MG TABS tablet Take 1 tablet (10 mg total) by mouth daily. 30 tablet 3   isosorbide mononitrate (IMDUR) 30 MG 24 hr tablet Take 1 tablet (30 mg total) by mouth daily. 30 tablet 3   pantoprazole (PROTONIX) 40 MG tablet Take 1 tablet by mouth daily.     sacubitril-valsartan (ENTRESTO) 24-26 MG Take 1 tablet by mouth 2 (two) times daily. 60 tablet 3   ticagrelor (BRILINTA) 90 MG TABS tablet TAKE 1 TABLET (90 MG TOTAL) BY MOUTH TWO TIMES DAILY. 60 tablet 3   No current facility-administered medications for this visit.   Allergies:  Patient has no known allergies.   Social History: The patient  reports that she has never smoked. She has never used smokeless tobacco. She reports previous alcohol use. She reports previous drug use.   Family History: The patient's family history includes Diabetes in her father; Unexplained death in her mother.   ROS:  Please see the history of present illness. Otherwise, complete review of systems is positive for none.  All other systems are reviewed and negative.   Physical Exam: VS:  BP 140/90   Pulse 74   Ht 5\' 7"  (1.702 m)   Wt 159 lb 3.2 oz (72.2 kg)   SpO2 97%   BMI 24.93 kg/m , BMI Body mass index is 24.93 kg/m.  Wt Readings from Last 3 Encounters:  11/09/20 159 lb 3.2 oz (72.2 kg)  09/21/20 159 lb (72.1 kg)  08/21/20 167 lb (75.8 kg)    General: Patient appears comfortable at rest. Neck: Supple, no elevated JVP or carotid bruits, no thyromegaly. Lungs: Clear to auscultation, nonlabored breathing at rest. Cardiac: Regular rate and rhythm, no S3 or significant systolic murmur, no pericardial rub. Extremities: No pitting edema, distal pulses 2+. Skin: Warm and dry. Musculoskeletal: No kyphosis. Neuropsychiatric: Alert and oriented x3, affect grossly appropriate.  ECG:  EKG August 08, 2020 sinus bradycardia rate of 57, LVH, U waves, nonspecific ST and T wave abnormality.  Prolonged QT.  Recent  Labwork: 08/03/2020: B Natriuretic Peptide 278.0; TSH 3.396 08/04/2020: ALT 16; AST 27 08/05/2020: Magnesium 1.8 08/08/2020: BUN 21; Creatinine, Ser 1.36; Potassium 3.7; Sodium 139 08/09/2020: Hemoglobin 11.5; Platelets 198     Component Value Date/Time   CHOL 266 (H) 08/04/2020 0519   TRIG 198 (H) 08/04/2020 0519   HDL 70 08/04/2020 0519   CHOLHDL 3.8 08/04/2020 0519   VLDL 40 08/04/2020 0519   LDLCALC 156 (H) 08/04/2020 0519    Other Studies Reviewed Today:   08/07/2020 CORONARY STENT INTERVENTION  Intravascular Ultrasound/IVUS  LEFT HEART CATH AND CORONARY ANGIOGRAPHY    Conclusion    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 This was a small vessel. Ost LAD to Prox LAD lesion is 70% stenosed. A drug-eluting stent was successfully placed using a STENT RESOLUTE ONYX 4.0X15 and optimized with intravascular ultrasound. Mid LAD lesion is 25% stenosed. Dist LAD lesion is 10% stenosed. Post intervention, there is a 0% residual stenosis. LV end diastolic pressure is normal. LVEDP 8 mm Hg. There is no aortic valve stenosis. A drug-eluting stent was successfully placed using a  STENT RESOLUTE ONYX 4.0X15.   Due to severe tortuosity at the proximal LAD, it was difficult to cross into the mid LAD.  Super cross catheter would probably be needed in the future if access to the LAD was required.   She also had some tortuosity in the right subclavian.  The right radial pulse was faint although by ultrasound, it appeared the right radial was patent.  The ulnar artery appeared to be larger which is why we chose this approach.  There was some vasospasm in the ulnar which was treated with additional verapamil at the end of the case.   Diagnostic Dominance: Right    Intervention          Echocardiogram 08/04/2020   1. Left ventricular ejection fraction, by estimation, is 40 to 45%. The  left ventricle has mildly decreased function. The left ventricle  demonstrates global hypokinesis. There is moderate  concentric left  ventricular hypertrophy. Left ventricular  diastolic parameters are indeterminate.   2. Right ventricular systolic function is normal. The right ventricular  size is normal.   3. Left atrial size was mildly dilated.   4. The mitral valve is grossly normal. Trivial mitral valve  regurgitation. Moderate to severe mitral annular calcification.   5. The aortic valve is tricuspid. Aortic valve regurgitation is not  visualized. No aortic stenosis is present.   6. The inferior vena cava is normal in size with greater than 50%  respiratory variability, suggesting right atrial pressure of 3 mmHg.   Comparison(s): No prior Echocardiogram.   Conclusion(s)/Recommendation(s): EF 40-45% with global hypokinesis,  moderate LVH. No significant valve disease.   Assessment and Plan:  1. NSTEMI (non-ST elevated myocardial infarction) (HCC)   2. HFrEF (heart failure with reduced ejection fraction) (HCC)   3. Acute respiratory failure with hypoxia (HCC)   4. Essential hypertension   5. Mixed hyperlipidemia   6. Type 2 diabetes mellitus without complication, without long-term current use of insulin (HCC)     1. NSTEMI (non-ST elevated myocardial infarction) [facility](HCC) Status post recent NSTEMI.  She denies any anginal or exertional symptoms. She is here for follow-up today after recent hospitalization for GI bleed.  Both her aspirin and Brilinta were stopped during that visit.  She was discharged on Plavix only.  She later saw her PCP who started Brilinta along with her Plavix.  Her aspirin had not been restarted.  Advised to stop the Plavix.  Restart aspirin 81 mg daily, continue Brilinta 90 mg p.o. twice daily.  Continue Imdur 30 mg daily   2. HFrEF (heart failure with reduced ejection fraction) (HCC) Recent echo showed EF 40 to 45% with global hypokinesis and moderate LVH.  Continue Coreg 6.25 mg p.o. twice daily.  Continue Imdur 30 mg daily.  Continue Entresto 24/26 mg p.o. twice daily.   In 3 months she will need a follow-up echocardiogram to reassess LV function in approximately 3 months.  3. Acute respiratory failure with hypoxia (HCC) No evidence of dyspnea or shortness of breath.  Oxygen saturation on room air is 98%.  4. Essential hypertension Blood pressure remains elevated today on follow-up.  BP today 140/90.  She states her systolic has been running in the low  140s.  Continue amlodipine to 5 mg p.o. daily.  She states she was only taking half of her 6.25 mg (3.125) of carvedilol p.o. twice daily.  Advised her to take the correct dose of 6.25 mg carvedilol p.o. twice daily for her consistently elevated systolic blood  pressures.  Continue checking blood pressure at home goal is 130/80 or less.  Please call if sustained blood pressures at or above 130/80.    5. Mixed hyperlipidemia Continue atorvastatin 40 mg p.o. daily.  Recent lipid panel on 08/04/2020 demonstrated cholesterol 266, triglycerides 198, HDL 70, LDL 156.  Please get follow-up FLP's and LFTs  6. Type 2 diabetes mellitus without complication, without long-term current use of insulin (HCC) Recent discovery of new onset diabetes during hospital stay with hemoglobin A1c of 6.9%.  She was started on Farxiga 10 mg daily.  To follow with PCP for management.  Medication Adjustments/Labs and Tests Ordered: Current medicines are reviewed at length with the patient today.  Concerns regarding medicines are outlined above.   Disposition: Follow-up with Dr. Diona Browner or APP 6 months Signed, Rennis Harding, NP 11/09/2020 9:04 AM    [facility] at [facility] [address] [place], [place], [place] [address] Phone: [phone]; Fax: [phone]

## 2020-11-09 NOTE — Patient Instructions (Addendum)
Medication Instructions:  Your physician has recommended you make the following change in your medication:  Stop Clopidogrel Restart aspirin 81 mg by mouth daily Continue other medications the same  Labwork: Your physician recommends that you return for a FASTING lipid/liver profile. Please do not eat or drink for at least 8 hours when you have this done. You may take your medications that morning with a sip of water. This can be done at [facility]. Orders have been faxed.   Testing/Procedures: none  Follow-Up: Your physician recommends that you schedule a follow-up appointment in: 6 months.  Any Other Special Instructions Will Be Listed Below (If Applicable).  If you need a refill on your cardiac medications before your next appointment, please call your pharmacy.

## 2020-11-09 NOTE — Addendum Note (Signed)
Addended by: Eustace Moore on: 11/09/2020 09:41 AM   Modules accepted: Orders

## 2020-11-12 NOTE — Telephone Encounter (Signed)
Patient rescheduled her OV with Dr. Wyline Mood today till August.

## 2020-11-13 ENCOUNTER — Encounter: Payer: Self-pay | Admitting: *Deleted

## 2020-11-23 ENCOUNTER — Telehealth: Payer: Self-pay | Admitting: Cardiology

## 2020-11-23 MED ORDER — DAPAGLIFLOZIN PROPANEDIOL 10 MG PO TABS: 10.0000 mg | ORAL_TABLET | Freq: Every day | ORAL | 3 refills | Status: DC

## 2020-11-23 MED ORDER — DAPAGLIFLOZIN PROPANEDIOL 10 MG PO TABS: 10.0000 mg | ORAL_TABLET | Freq: Every day | ORAL | 0 refills | Status: DC

## 2020-11-23 NOTE — Telephone Encounter (Signed)
Advised that farxiga samples are available for pick up along with AZ&ME application that she is to fill out all patient sections and bring back to office with proof of income and prescription out of pocket expense for 2022. Verbalized understanding.

## 2020-11-23 NOTE — Telephone Encounter (Signed)
   1. Which medications need to be refilled? (please list name of each medication and dose if known)  Farxiga 10 mg  2. Which pharmacy/location (including street and city if local pharmacy) is medication to be sent to? [facility] [place], [place]   3. Do they need a 30 day or 90 day supply?  PATIENT IS REQUESTING TO SEE IF THIS MEDICATION COMES IN A GENETRIC. COST $480.00 FOR 30 PILLS.   [phone]

## 2020-12-05 ENCOUNTER — Ambulatory Visit: Payer: Medicare Other | Admitting: Family Medicine

## 2020-12-24 ENCOUNTER — Ambulatory Visit: Payer: Medicare Other | Admitting: Family Medicine

## 2021-03-01 ENCOUNTER — Other Ambulatory Visit: Payer: Self-pay | Admitting: *Deleted

## 2021-03-01 ENCOUNTER — Telehealth: Payer: Self-pay | Admitting: Cardiology

## 2021-03-01 MED ORDER — AMLODIPINE BESYLATE 5 MG PO TABS: 5.0000 mg | ORAL_TABLET | Freq: Every day | ORAL | 3 refills | Status: DC

## 2021-03-01 NOTE — Telephone Encounter (Signed)
Prescription sent on 03/01/21

## 2021-03-01 NOTE — Telephone Encounter (Signed)
    1. Which medications need to be refilled? (please list name of each medication and dose if known) amlodipine 5 mg  2. Which pharmacy/location (including street and city if local pharmacy) is medication to be sent [facility] [place] [place]   3. Do they need a 30 day or 90 day supply? 90   Patient is out of medication.

## 2021-05-19 ENCOUNTER — Encounter: Payer: Self-pay | Admitting: Cardiology

## 2021-05-19 NOTE — Progress Notes (Signed)
Cardiology Office Note  Date: 05/20/2021   ID: Cassandra, Johnson 12-14-1945, MRN [id number]  PCP:  Amedeo Plenty, DO  Cardiologist:  Nona Dell, MD Electrophysiologist:  None   Chief Complaint  Patient presents with   Cardiac follow-up    History of Present Illness: Cassandra Johnson is a 75 y.o. female last seen in June by Mr. Vincenza Hews NP, I reviewed the note.  This is our first follow-up visit in the office, I reviewed records and updated the chart.  She is here today with her granddaughter.  She tells me that she feels well, no exertional chest pain or palpitations, NYHA class I-II dyspnea with typical ADLs and walking up steps.  She denies any orthopnea or PND, no leg swelling.  Blood pressure was significantly elevated today.  I rechecked it at 160/100.  She states that she is taking her medications, we reviewed them in detail.  She has not been checking her blood pressure at home.  PCP is Dr. Onalee Hua in [place], we are requesting her recent lab work.  Past Medical History:  Diagnosis Date   CAD (coronary artery disease)    DES to LAD March 2022   CKD (chronic kidney disease) stage 3, GFR 30-59 ml/min (HCC)    Essential hypertension    GI bleed    Mixed hyperlipidemia    Secondary cardiomyopathy (HCC)     Past Surgical History:  Procedure Laterality Date   CORONARY STENT INTERVENTION N/A 08/07/2020   Procedure: CORONARY STENT INTERVENTION;  Surgeon: Corky Crafts, MD;  Location: [facility] INVASIVE CV LAB;  Service: Cardiovascular;  Laterality: N/A;   INTRAVASCULAR ULTRASOUND/IVUS N/A 08/07/2020   Procedure: Intravascular Ultrasound/IVUS;  Surgeon: Corky Crafts, MD;  Location: [facility] INVASIVE CV LAB;  Service: Cardiovascular;  Laterality: N/A;   LEFT HEART CATH AND CORONARY ANGIOGRAPHY N/A 08/07/2020   Procedure: LEFT HEART CATH AND CORONARY ANGIOGRAPHY;  Surgeon: Corky Crafts, MD;  Location: [facility] INVASIVE CV LAB;  Service: Cardiovascular;   Laterality: N/A;   No prior surgeries      Current Outpatient Medications  Medication Sig Dispense Refill   amLODipine (NORVASC) 5 MG tablet Take 1 tablet (5 mg total) by mouth daily. 90 tablet 3   aspirin EC 81 MG tablet Take 1 tablet (81 mg total) by mouth daily. Swallow whole. 90 tablet 3   atorvastatin (LIPITOR) 40 MG tablet TAKE 1 TABLET (40 MG TOTAL) BY MOUTH DAILY AT 6 PM. 30 tablet 3   clopidogrel (PLAVIX) 75 MG tablet Take 1 tablet by mouth daily.     glipiZIDE (GLUCOTROL) 5 MG tablet Take 1 tablet by mouth daily.     isosorbide mononitrate (IMDUR) 30 MG 24 hr tablet Take 1 tablet (30 mg total) by mouth daily. 30 tablet 3   sacubitril-valsartan (ENTRESTO) 24-26 MG Take 1 tablet by mouth 2 (two) times daily. 60 tablet 3   carvedilol (COREG) 6.25 MG tablet Take 1 tablet (6.25 mg total) by mouth 2 (two) times daily with a meal. 60 tablet 3   No current facility-administered medications for this visit.   Allergies:  Patient has no known allergies.   ROS: No syncope.  Physical Exam: VS:  BP (!) 178/102    Pulse 65    Ht 5\' 8"  (1.727 m)    Wt 161 lb 6.4 oz (73.2 kg)    SpO2 99%    BMI 24.54 kg/m , BMI Body mass index is 24.54 kg/m.  Wt Readings from  Last 3 Encounters:  05/20/21 161 lb 6.4 oz (73.2 kg)  11/09/20 159 lb 3.2 oz (72.2 kg)  09/21/20 159 lb (72.1 kg)    General: Patient appears comfortable at rest. HEENT: Conjunctiva and lids normal, wearing a mask. Neck: Supple, no elevated JVP or carotid bruits, no thyromegaly. Lungs: Clear to auscultation, nonlabored breathing at rest. Cardiac: Regular rate and rhythm, no S3, 1/6 systolic murmur. Extremities: No pitting edema.  ECG:  An ECG dated 08/08/2020 was personally reviewed today and demonstrated:  Sinus rhythm with LVH and repolarization abnormalities.  Recent Labwork: 08/03/2020: B Natriuretic Peptide 278.0; TSH 3.396 08/04/2020: ALT 16; AST 27 08/05/2020: Magnesium 1.8 08/08/2020: BUN 21; Creatinine, Ser 1.36; Potassium  3.7; Sodium 139 08/09/2020: Hemoglobin 11.5; Platelets 198     Component Value Date/Time   CHOL 266 (H) 08/04/2020 0519   TRIG 198 (H) 08/04/2020 0519   HDL 70 08/04/2020 0519   CHOLHDL 3.8 08/04/2020 0519   VLDL 40 08/04/2020 0519   LDLCALC 156 (H) 08/04/2020 0519    Other Studies Reviewed Today:  Echocardiogram 08/04/2020:  1. Left ventricular ejection fraction, by estimation, is 40 to 45%. The  left ventricle has mildly decreased function. The left ventricle  demonstrates global hypokinesis. There is moderate concentric left  ventricular hypertrophy. Left ventricular  diastolic parameters are indeterminate.   2. Right ventricular systolic function is normal. The right ventricular  size is normal.   3. Left atrial size was mildly dilated.   4. The mitral valve is grossly normal. Trivial mitral valve  regurgitation. Moderate to severe mitral annular calcification.   5. The aortic valve is tricuspid. Aortic valve regurgitation is not  visualized. No aortic stenosis is present.   6. The inferior vena cava is normal in size with greater than 50%  respiratory variability, suggesting right atrial pressure of 3 mmHg.   Cardiac catheterization 08/07/2020: 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 This was a small vessel. Ost LAD to Prox LAD lesion is 70% stenosed. A drug-eluting stent was successfully placed using a STENT RESOLUTE ONYX 4.0X15 and optimized with intravascular ultrasound. Mid LAD lesion is 25% stenosed. Dist LAD lesion is 10% stenosed. Post intervention, there is a 0% residual stenosis. LV end diastolic pressure is normal. LVEDP 8 mm Hg. There is no aortic valve stenosis. A drug-eluting stent was successfully placed using a STENT RESOLUTE ONYX 4.0X15.   Due to severe tortuosity at the proximal LAD, it was difficult to cross into the mid LAD.  Super cross catheter would probably be needed in the future if access to the LAD was required.   She also had some tortuosity in the right  subclavian.  The right radial pulse was faint although by ultrasound, it appeared the right radial was patent.  The ulnar artery appeared to be larger which is why we chose this approach.  There was some vasospasm in the ulnar which was treated with additional verapamil at the end of the case.  Assessment and Plan:  1.  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  I reviewed her medications which now include Coreg, Imdur, Norvasc, and Entresto.  She is currently not on a diuretic.  Requesting recent follow-up lab work from PCP.  Will most likely increase Entresto next.  May be able to add low-dose Aldactone eventually.  2.  Essential hypertension, blood pressure elevated today.  I asked her to check her blood pressure at home, she states that her daughter has an  automatic cuff.  This will help better guide medical therapy adjustments.  3.  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  Review recent lab work from PCP.  4.  CAD status post DES to the LAD in March.  No active angina at this time.  She remains on aspirin and Plavix, also Lipitor (has not had this until recently so we will hold off on reassessing lipids).  Medication Adjustments/Labs and Tests Ordered: Current medicines are reviewed at length with the patient today.  Concerns regarding medicines are outlined above.   Tests Ordered: No orders of the defined types were placed in this encounter.   Medication Changes: Meds ordered this encounter  Medications   carvedilol (COREG) 6.25 MG tablet    Sig: Take 1 tablet (6.25 mg total) by mouth 2 (two) times daily with a meal.    Dispense:  60 tablet    Refill:  3    Disposition:  Follow up  6 to 8 weeks.  Signed, Satira Sark, MD, [facility] 05/20/2021 10:10 AM    [place] [facility] [place], [place], [place] [address] Phone: [phone]; Fax: [phone]

## 2021-05-20 ENCOUNTER — Ambulatory Visit (INDEPENDENT_AMBULATORY_CARE_PROVIDER_SITE_OTHER): Payer: Medicare Other | Admitting: Cardiology

## 2021-05-20 ENCOUNTER — Encounter: Payer: Self-pay | Admitting: Cardiology

## 2021-05-20 ENCOUNTER — Encounter: Payer: Self-pay | Admitting: *Deleted

## 2021-05-20 VITALS — BP 178/102 | HR 65 | Ht 68.0 in | Wt 161.4 lb

## 2021-05-20 DIAGNOSIS — I10 Essential (primary) hypertension: Secondary | ICD-10-CM

## 2021-05-20 DIAGNOSIS — I25119 Atherosclerotic heart disease of native coronary artery with unspecified angina pectoris: Secondary | ICD-10-CM

## 2021-05-20 DIAGNOSIS — N1832 Chronic kidney disease, stage 3b: Secondary | ICD-10-CM

## 2021-05-20 DIAGNOSIS — I429 Cardiomyopathy, unspecified: Secondary | ICD-10-CM

## 2021-05-20 MED ORDER — CARVEDILOL 6.25 MG PO TABS: 6.2500 mg | ORAL_TABLET | Freq: Two times a day (BID) | ORAL | 3 refills | Status: DC

## 2021-05-20 NOTE — Patient Instructions (Signed)
Medication Instructions:  Your physician has recommended you make the following change in your medication:  Increase carvedilol to 6.25 mg twice daily Continue other medications the same  Labwork: none  Testing/Procedures: none  Follow-Up: Your physician recommends that you schedule a follow-up appointment in: 6-8 weeks  Any Other Special Instructions Will Be Listed Below (If Applicable). Your physician has requested that you regularly monitor and record your blood pressure readings at home. Please use the same machine at the same time of day to check your readings and record them to bring to your follow-up visit.  If you need a refill on your cardiac medications before your next appointment, please call your pharmacy.

## 2021-07-16 NOTE — Progress Notes (Signed)
Cardiology Office Note  Date: 07/17/2021   ID: Kimblery, Diop July 30, 1945, MRN [id number]  PCP:  Darius Bump, DO  Cardiologist:  Rozann Lesches, MD Electrophysiologist:  None   Chief Complaint  Patient presents with   Cardiac follow-up    History of Present Illness: Cassandra Johnson is a 76 y.o. female last seen in December 2022.  She is here for a follow-up visit.  Blood pressure significantly elevated, we discussed her medications and as it turns out she has not been taking either Entresto or Norvasc.  I had nursing contact her pharmacy and review prescriptions.  Despite this she states that she continues to feel well, NYHA class I dyspnea, no exertional chest pain.  I talked with her about trying to get her medications stabilized and consistent prior to making additional adjustments in dosing.  We will also set up a be met to reevaluate renal function and potassium.  Today's ECG is normal.  Past Medical History:  Diagnosis Date   CAD (coronary artery disease)    DES to LAD March 2022   CKD (chronic kidney disease) stage 3, GFR 30-59 ml/min (HCC)    Essential hypertension    GI bleed    Mixed hyperlipidemia    Secondary cardiomyopathy ([place])     Past Surgical History:  Procedure Laterality Date   CORONARY STENT INTERVENTION N/A 08/07/2020   Procedure: CORONARY STENT INTERVENTION;  Surgeon: Jettie Booze, MD;  Location: [facility];  Service: Cardiovascular;  Laterality: N/A;   INTRAVASCULAR ULTRASOUND/IVUS N/A 08/07/2020   Procedure: Intravascular Ultrasound/IVUS;  Surgeon: Jettie Booze, MD;  Location: [facility];  Service: Cardiovascular;  Laterality: N/A;   LEFT HEART CATH AND CORONARY ANGIOGRAPHY N/A 08/07/2020   Procedure: LEFT HEART CATH AND CORONARY ANGIOGRAPHY;  Surgeon: Jettie Booze, MD;  Location: [facility];  Service: Cardiovascular;  Laterality: N/A;   No prior surgeries      Current Outpatient Medications   Medication Sig Dispense Refill   amLODipine (NORVASC) 5 MG tablet Take 1 tablet (5 mg total) by mouth daily. 90 tablet 1   aspirin EC 81 MG tablet Take 1 tablet (81 mg total) by mouth daily. Swallow whole. 90 tablet 3   atorvastatin (LIPITOR) 40 MG tablet TAKE 1 TABLET (40 MG TOTAL) BY MOUTH DAILY AT 6 PM. 30 tablet 3   carvedilol (COREG) 12.5 MG tablet Take 12.5 mg by mouth 2 (two) times daily.     clopidogrel (PLAVIX) 75 MG tablet Take 1 tablet by mouth daily.     glipiZIDE (GLUCOTROL) 5 MG tablet Take 1 tablet by mouth daily.     isosorbide mononitrate (IMDUR) 30 MG 24 hr tablet Take 1 tablet (30 mg total) by mouth daily. 30 tablet 3   sacubitril-valsartan (ENTRESTO) 24-26 MG Take 1 tablet by mouth 2 (two) times daily. 28 tablet 0   No current facility-administered medications for this visit.   Allergies:  Patient has no known allergies.   ROS: No palpitations or syncope.  Physical Exam: VS:  BP (!) 190/106 (BP Location: Left Arm, Cuff Size: Normal)    Pulse 75    Ht '5\' 7"'  (1.702 m)    Wt 159 lb 3.2 oz (72.2 kg)    SpO2 97%    BMI 24.93 kg/m , BMI Body mass index is 24.93 kg/m.  Wt Readings from Last 3 Encounters:  07/17/21 159 lb 3.2 oz (72.2 kg)  05/20/21 161 lb 6.4  oz (73.2 kg)  11/09/20 159 lb 3.2 oz (72.2 kg)    General: Patient appears comfortable at rest. HEENT: Conjunctiva and lids normal, mask. Neck: Supple, no elevated JVP or carotid bruits, no thyromegaly. Lungs: Clear to auscultation, nonlabored breathing at rest. Cardiac: Regular rate and rhythm, no S3, 1/6 systolic murmur. Extremities: No pitting edema.  ECG:  An ECG dated 08/08/2020 was personally reviewed today and demonstrated:  Sinus rhythm with LVH and repolarization abnormalities.  Recent Labwork: 08/03/2020: B Natriuretic Peptide 278.0; TSH 3.396 08/04/2020: ALT 16; AST 27 08/05/2020: Magnesium 1.8 08/08/2020: BUN 21; Creatinine, Ser 1.36; Potassium 3.7; Sodium 139 08/09/2020: Hemoglobin 11.5; Platelets 198      Component Value Date/Time   CHOL 266 (H) 08/04/2020 0519   TRIG 198 (H) 08/04/2020 0519   HDL 70 08/04/2020 0519   CHOLHDL 3.8 08/04/2020 0519   VLDL 40 08/04/2020 0519   LDLCALC 156 (H) 08/04/2020 0519    Other Studies Reviewed Today:  Echocardiogram 08/04/2020:  1. Left ventricular ejection fraction, by estimation, is 40 to 45%. The  left ventricle has mildly decreased function. The left ventricle  demonstrates global hypokinesis. There is moderate concentric left  ventricular hypertrophy. Left ventricular  diastolic parameters are indeterminate.   2. Right ventricular systolic function is normal. The right ventricular  size is normal.   3. Left atrial size was mildly dilated.   4. The mitral valve is grossly normal. Trivial mitral valve  regurgitation. Moderate to severe mitral annular calcification.   5. The aortic valve is tricuspid. Aortic valve regurgitation is not  visualized. No aortic stenosis is present.   6. The inferior vena cava is normal in size with greater than 50%  respiratory variability, suggesting right atrial pressure of 3 mmHg.    Cardiac catheterization 08/07/2020: 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 This was a small vessel. Ost LAD to Prox LAD lesion is 70% stenosed. A drug-eluting stent was successfully placed using a STENT RESOLUTE ONYX 4.0X15 and optimized with intravascular ultrasound. Mid LAD lesion is 25% stenosed. Dist LAD lesion is 10% stenosed. Post intervention, there is a 0% residual stenosis. LV end diastolic pressure is normal. LVEDP 8 mm Hg. There is no aortic valve stenosis. A drug-eluting stent was successfully placed using a STENT RESOLUTE ONYX 4.0X15.   Due to severe tortuosity at the proximal LAD, it was difficult to cross into the mid LAD.  Super cross catheter would probably be needed in the future if access to the LAD was required.   She also had some tortuosity in the right subclavian.  The right radial pulse was faint although by  ultrasound, it appeared the right radial was patent.  The ulnar artery appeared to be larger which is why we chose this approach.  There was some vasospasm in the ulnar which was treated with additional verapamil at the end of the case.  Assessment and Plan:  1.  HFrEF with mixed ischemic/nonischemic cardiomyopathy and LVEF 40 to 45% by last assessment.  Medical therapy has not been consistent as discussed above, prescriptions sent in for both Norvasc and Entresto at prior doses.  Plan to continue along with Coreg and Imdur, check BMET, and then make further adjustments from there with reevaluation of LVEF ultimately.  2.  Essential hypertension, blood pressure not controlled in the absence of regular medical therapy.  Medications are being addressed as discussed above.  3.  CAD status post DES to the LAD in March 2022.  She does not report any active  angina symptoms, has been taking aspirin and Plavix.  Also on Lipitor.  4.  CKD stage IIIb, recheck BMET.  Medication Adjustments/Labs and Tests Ordered: Current medicines are reviewed at length with the patient today.  Concerns regarding medicines are outlined above.   Tests Ordered: Orders Placed This Encounter  Procedures   Basic metabolic panel   Basic metabolic panel   EKG 90-BPJP    Medication Changes: Meds ordered this encounter  Medications   DISCONTD: sacubitril-valsartan (ENTRESTO) 24-26 MG    Sig: Take 1 tablet by mouth 2 (two) times daily.    Dispense:  60 tablet    Refill:  6    07/17/2021 Restart   amLODipine (NORVASC) 5 MG tablet    Sig: Take 1 tablet (5 mg total) by mouth daily.    Dispense:  90 tablet    Refill:  1    07/17/2021 Restart   sacubitril-valsartan (ENTRESTO) 24-26 MG    Sig: Take 1 tablet by mouth 2 (two) times daily.    Dispense:  28 tablet    Refill:  0    Lot # [id number] Exp 07/2023 07/17/2021 Restart    Disposition:  Follow up  4 weeks.  Signed, Satira Sark, MD, [facility] 07/17/2021 10:22  AM    [facility] [place], [place], [place] [address] Phone: [phone]; Fax: [phone]

## 2021-07-17 ENCOUNTER — Other Ambulatory Visit: Payer: Self-pay | Admitting: *Deleted

## 2021-07-17 ENCOUNTER — Encounter: Payer: Self-pay | Admitting: Cardiology

## 2021-07-17 ENCOUNTER — Ambulatory Visit (INDEPENDENT_AMBULATORY_CARE_PROVIDER_SITE_OTHER): Payer: Medicare Other | Admitting: Cardiology

## 2021-07-17 VITALS — BP 190/106 | HR 75 | Ht 67.0 in | Wt 159.2 lb

## 2021-07-17 DIAGNOSIS — I25119 Atherosclerotic heart disease of native coronary artery with unspecified angina pectoris: Secondary | ICD-10-CM | POA: Diagnosis not present

## 2021-07-17 DIAGNOSIS — I10 Essential (primary) hypertension: Secondary | ICD-10-CM | POA: Diagnosis not present

## 2021-07-17 DIAGNOSIS — I5020 Unspecified systolic (congestive) heart failure: Secondary | ICD-10-CM | POA: Diagnosis not present

## 2021-07-17 DIAGNOSIS — Z79899 Other long term (current) drug therapy: Secondary | ICD-10-CM | POA: Diagnosis not present

## 2021-07-17 MED ORDER — SACUBITRIL-VALSARTAN 24-26 MG PO TABS: 1.0000 | ORAL_TABLET | Freq: Two times a day (BID) | ORAL | 0 refills | Status: DC

## 2021-07-17 MED ORDER — AMLODIPINE BESYLATE 5 MG PO TABS: 5.0000 mg | ORAL_TABLET | Freq: Every day | ORAL | 1 refills | Status: DC

## 2021-07-17 MED ORDER — SACUBITRIL-VALSARTAN 24-26 MG PO TABS
1.0000 | ORAL_TABLET | Freq: Two times a day (BID) | ORAL | 3 refills | Status: DC
Start: 2021-07-17 — End: 2022-05-05

## 2021-07-17 MED ORDER — SACUBITRIL-VALSARTAN 24-26 MG PO TABS: 1.0000 | ORAL_TABLET | Freq: Two times a day (BID) | ORAL | 6 refills | Status: DC

## 2021-07-17 NOTE — Patient Instructions (Signed)
Medication Instructions:  Your physician has recommended you make the following change in your medication:  Restart amlodipine 5 mg daily Restart entresto 24/26 mg twice daily Continue other medications the same  Labwork: BMET today at [facility] in 4 weeks just before your next visit at [facility] No appointment needed Non-fasting  Testing/Procedures: none  Follow-Up: Your physician recommends that you schedule a follow-up appointment in: 4 weeks  Any Other Special Instructions Will Be Listed Below (If Applicable).  If you need a refill on your cardiac medications before your next appointment, please call your pharmacy.

## 2021-07-18 ENCOUNTER — Telehealth: Payer: Self-pay | Admitting: *Deleted

## 2021-07-18 NOTE — Telephone Encounter (Signed)
-----   Message from Jonelle Sidle, MD sent at 07/17/2021  8:27 PM EST ----- Results reviewed.  Her renal function looks better, creatinine down to 1.13 and potassium normal.  Let's see how she does with resumption of medications discussed today.

## 2021-07-19 NOTE — Telephone Encounter (Signed)
Patient's daughter Lowella Bandy informed. Copy sent to PCP

## 2021-08-28 NOTE — Progress Notes (Signed)
? ? ?Cardiology Office Note ? ?Date: 08/29/2021  ? ?Cassandra Johnson, DOB 01-14-1946, MRN [id number] ? ?PCP:  Darius Bump, DO  ?Cardiologist:  Rozann Lesches, MD ?Electrophysiologist:  None  ? ?Chief Complaint  ?Patient presents with  ? Cardiac follow-up  ? ? ?History of Present Illness: ?Cassandra Johnson is a 76 y.o. female last seen in February.  She is here for a follow-up visit.  Reports NYHA class I dyspnea, no angina symptoms, no leg swelling or weight gain. ? ?Medical therapy was adjusted at last visit, she reports compliance with medications as detailed below.  Her blood pressure has come down significantly, although still not at goal. ? ?We discussed getting follow-up lab work and continuing to optimize GDMT.  Eventually, we will obtain a follow-up echocardiogram for reassessment. ? ?Past Medical History:  ?Diagnosis Date  ? CAD (coronary artery disease)   ? DES to LAD March 2022  ? CKD (chronic kidney disease) stage 3, GFR 30-59 ml/min (HCC)   ? Essential hypertension   ? GI bleed   ? Mixed hyperlipidemia   ? Secondary cardiomyopathy ([place])   ? ? ?Past Surgical History:  ?Procedure Laterality Date  ? CORONARY STENT INTERVENTION N/A 08/07/2020  ? Procedure: CORONARY STENT INTERVENTION;  Surgeon: Jettie Booze, MD;  Location: [facility];  Service: Cardiovascular;  Laterality: N/A;  ? INTRAVASCULAR ULTRASOUND/IVUS N/A 08/07/2020  ? Procedure: Intravascular Ultrasound/IVUS;  Surgeon: Jettie Booze, MD;  Location: [facility];  Service: Cardiovascular;  Laterality: N/A;  ? LEFT HEART CATH AND CORONARY ANGIOGRAPHY N/A 08/07/2020  ? Procedure: LEFT HEART CATH AND CORONARY ANGIOGRAPHY;  Surgeon: Jettie Booze, MD;  Location: [facility];  Service: Cardiovascular;  Laterality: N/A;  ? No prior surgeries    ? ? ?Current Outpatient Medications  ?Medication Sig Dispense Refill  ? amLODipine (NORVASC) 5 MG tablet Take 1 tablet (5 mg total) by mouth daily. 90 tablet 1  ?  aspirin EC 81 MG tablet Take 1 tablet (81 mg total) by mouth daily. Swallow whole. 90 tablet 3  ? carvedilol (COREG) 12.5 MG tablet Take 12.5 mg by mouth 2 (two) times daily.    ? clopidogrel (PLAVIX) 75 MG tablet Take 1 tablet by mouth daily.    ? glipiZIDE (GLUCOTROL) 5 MG tablet Take 1 tablet by mouth daily.    ? isosorbide mononitrate (IMDUR) 30 MG 24 hr tablet Take 1 tablet (30 mg total) by mouth daily. 30 tablet 3  ? latanoprost (XALATAN) 0.005 % ophthalmic solution Place 1 drop into both eyes at bedtime.    ? sacubitril-valsartan (ENTRESTO) 24-26 MG Take 1 tablet by mouth 2 (two) times daily. 180 tablet 3  ? atorvastatin (LIPITOR) 40 MG tablet TAKE 1 TABLET (40 MG TOTAL) BY MOUTH DAILY AT 6 PM. (Patient not taking: Reported on 08/29/2021) 30 tablet 3  ? ?No current facility-administered medications for this visit.  ? ?Allergies:  Patient has no known allergies.  ? ?ROS: No palpitations or syncope. ? ?Physical Exam: ?VS:  BP (!) 148/88   Pulse 70   Ht 5' 7.5" (1.715 m)   Wt 159 lb 6.4 oz (72.3 kg)   SpO2 98%   BMI 24.60 kg/m? , BMI Body mass index is 24.6 kg/m?. ? ?Wt Readings from Last 3 Encounters:  ?08/29/21 159 lb 6.4 oz (72.3 kg)  ?07/17/21 159 lb 3.2 oz (72.2 kg)  ?05/20/21 161 lb 6.4 oz (73.2 kg)  ?  ?General: Patient  appears comfortable at rest. ?HEENT: Conjunctiva and lids normal. ?Neck: Supple, no elevated JVP or carotid bruits, no thyromegaly. ?Lungs: Clear to auscultation, nonlabored breathing at rest. ?Cardiac: Regular rate and rhythm, no S3, 1/6 systolic murmur, no pericardial rub. ?Extremities: No pitting edema. ? ?ECG:  An ECG dated 07/17/2021 was personally reviewed today and demonstrated:  Sinus rhythm. ? ?Recent Labwork: ? ?February 2023: Potassium 3.6, BUN 12, creatinine 1.13 ? ?Other Studies Reviewed Today: ? ?Echocardiogram 08/04/2020: ? 1. Left ventricular ejection fraction, by estimation, is 40 to 45%. The  ?left ventricle has mildly decreased function. The left ventricle   ?demonstrates global hypokinesis. There is moderate concentric left  ?ventricular hypertrophy. Left ventricular  ?diastolic parameters are indeterminate.  ? 2. Right ventricular systolic function is normal. The right ventricular  ?size is normal.  ? 3. Left atrial size was mildly dilated.  ? 4. The mitral valve is grossly normal. Trivial mitral valve  ?regurgitation. Moderate to severe mitral annular calcification.  ? 5. The aortic valve is tricuspid. Aortic valve regurgitation is not  ?visualized. No aortic stenosis is present.  ? 6. The inferior vena cava is normal in size with greater than 50%  ?respiratory variability, suggesting right atrial pressure of 3 mmHg.  ?  ?Cardiac catheterization 08/07/2020: ?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 This was a small vessel. ?Ost LAD to Prox LAD lesion is 70% stenosed. A drug-eluting stent was successfully placed using a STENT RESOLUTE ONYX 4.0X15 and optimized with intravascular ultrasound. ?Mid LAD lesion is 25% stenosed. ?Dist LAD lesion is 10% stenosed. ?Post intervention, there is a 0% residual stenosis. ?LV end diastolic pressure is normal. LVEDP 8 mm Hg. ?There is no aortic valve stenosis. ?A drug-eluting stent was successfully placed using a STENT RESOLUTE ONYX 4.0X15. ?  ?Due to severe tortuosity at the proximal LAD, it was difficult to cross into the mid LAD.  Super cross catheter would probably be needed in the future if access to the LAD was required. ?  ?She also had some tortuosity in the right subclavian.  The right radial pulse was faint although by ultrasound, it appeared the right radial was patent.  The ulnar artery appeared to be larger which is why we chose this approach.  There was some vasospasm in the ulnar which was treated with additional verapamil at the end of the case. ? ?Assessment and Plan: ? ?1.  HFrEF with mixed ischemic/nonischemic cardiomyopathy, last LVEF 40 to 45% as of March 2022.  Medical therapy adjusted as of last visit, she reports  compliance at this point following prior inconsistency with regimen.  Plan to obtain a BMET and then continue to adjust GDMT, likely with addition of Aldactone and possibly up titration of Entresto.  Might be able to stop Norvasc ultimately.  Once therapy better optimized would repeat an echocardiogram. ? ?2.  Essential hypertension, blood pressure trend is much better although not yet optimal.  Continue adjust medications as noted above. ? ?3.  CAD status post DES to the LAD in March 2022.  She does not report any active angina at this time continue aspirin, Plavix, and Lipitor.  Might be able to drop Imdur eventually. ? ?4.  History of CKD stage IIIb.  Most recent creatinine 1.13 with normal potassium. ? ?Medication Adjustments/Labs and Tests Ordered: ?Current medicines are reviewed at length with the patient today.  Concerns regarding medicines are outlined above.  ? ?Tests Ordered: ?No orders of the defined types were placed in this encounter. ? ? ?Medication  Changes: ?No orders of the defined types were placed in this encounter. ? ? ?Disposition:  Follow up  6 weeks. ? ?Signed, ?Satira Sark, MD, [facility] ?08/29/2021 9:07 AM    ?[facility] ?[address], [place], [place] [address] ?Phone: [phone]; Fax: [phone]  ?

## 2021-08-29 ENCOUNTER — Ambulatory Visit (INDEPENDENT_AMBULATORY_CARE_PROVIDER_SITE_OTHER): Payer: Medicare Other | Admitting: Cardiology

## 2021-08-29 ENCOUNTER — Encounter: Payer: Self-pay | Admitting: Cardiology

## 2021-08-29 VITALS — BP 148/88 | HR 70 | Ht 67.5 in | Wt 159.4 lb

## 2021-08-29 DIAGNOSIS — I10 Essential (primary) hypertension: Secondary | ICD-10-CM | POA: Diagnosis not present

## 2021-08-29 DIAGNOSIS — N1832 Chronic kidney disease, stage 3b: Secondary | ICD-10-CM | POA: Diagnosis not present

## 2021-08-29 DIAGNOSIS — I25119 Atherosclerotic heart disease of native coronary artery with unspecified angina pectoris: Secondary | ICD-10-CM | POA: Diagnosis not present

## 2021-08-29 DIAGNOSIS — I5020 Unspecified systolic (congestive) heart failure: Secondary | ICD-10-CM

## 2021-08-29 NOTE — Patient Instructions (Signed)
Medication Instructions:  ?Your physician recommends that you continue on your current medications as directed. Please refer to the Current Medication list given to you today. ? ?Labwork: ?BMET today at [facility] ? ?Testing/Procedures: ?none ? ?Follow-Up: ?Your physician recommends that you schedule a follow-up appointment in: 6 weeks at the [place] office ? ?Any Other Special Instructions Will Be Listed Below (If Applicable). ? ?If you need a refill on your cardiac medications before your next appointment, please call your pharmacy. ?

## 2021-08-30 IMAGING — DX DG CHEST 1V PORT
1 series · 1 of 1 positions shown · non-contrast
Comparison: Yesterday

CLINICAL DATA: CHF

EXAM:
PORTABLE CHEST 1 VIEW

[chest ap]
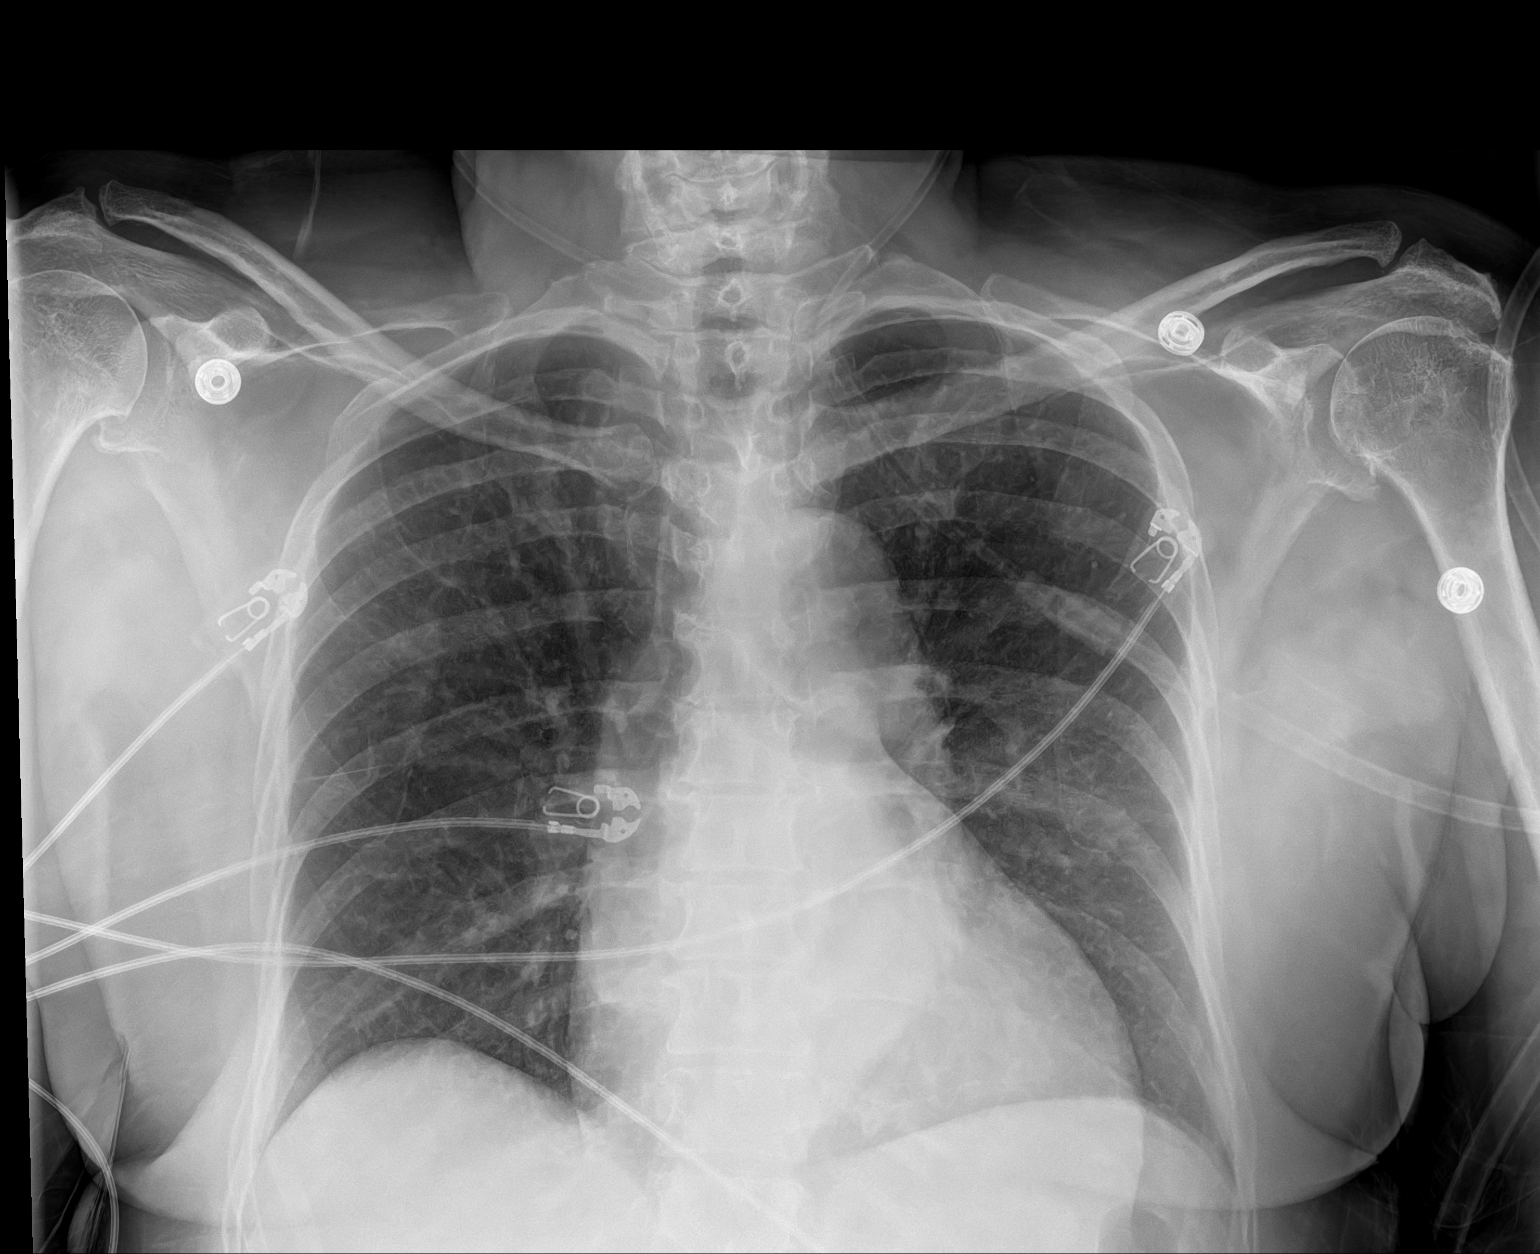

[1 of 1 positions shown; findings below may reference images not displayed]

FINDINGS: Resolved pulmonary edema. Stable generous heart size with aortic
tortuosity. No visible effusion or pneumothorax.
IMPRESSION: Resolved pulmonary edema.

## 2021-09-04 ENCOUNTER — Telehealth: Payer: Self-pay | Admitting: *Deleted

## 2021-09-04 DIAGNOSIS — I5020 Unspecified systolic (congestive) heart failure: Secondary | ICD-10-CM

## 2021-09-04 DIAGNOSIS — Z79899 Other long term (current) drug therapy: Secondary | ICD-10-CM

## 2021-09-04 MED ORDER — SPIRONOLACTONE 25 MG PO TABS: 12.5000 mg | ORAL_TABLET | Freq: Every day | ORAL | 1 refills | Status: DC

## 2021-09-04 NOTE — Telephone Encounter (Signed)
-----   Message from Satira Sark, MD sent at 08/29/2021  5:01 PM EDT ----- ?Results reviewed.  Potassium 4.1 and creatinine 1.20.  Would start Aldactone 12.5 mg daily.  Repeat BMET for her follow-up visit already scheduled. ?

## 2021-09-04 NOTE — Telephone Encounter (Signed)
Patient informed and verbalized understanding of plan. Copy sent to PCP ?Lab order placed for [facility] ?

## 2021-09-10 ENCOUNTER — Encounter: Payer: Self-pay | Admitting: *Deleted

## 2021-09-10 NOTE — Progress Notes (Signed)
Entresto approved through [employer] starting 07/27/2021 - 06/01/2022 ?

## 2021-10-25 ENCOUNTER — Encounter: Payer: Self-pay | Admitting: Physician Assistant

## 2021-10-25 ENCOUNTER — Other Ambulatory Visit (HOSPITAL_COMMUNITY)
Admission: RE | Admit: 2021-10-25 | Discharge: 2021-10-25 | Disposition: A | Discharge status: Home / Self Care | Payer: Medicare Other | Source: Ambulatory Visit | Attending: Physician Assistant | Admitting: Physician Assistant

## 2021-10-25 ENCOUNTER — Ambulatory Visit (INDEPENDENT_AMBULATORY_CARE_PROVIDER_SITE_OTHER): Payer: Medicare Other | Admitting: Physician Assistant

## 2021-10-25 VITALS — BP 138/78 | HR 80 | Ht 67.5 in | Wt 165.4 lb

## 2021-10-25 DIAGNOSIS — E782 Mixed hyperlipidemia: Secondary | ICD-10-CM | POA: Diagnosis present

## 2021-10-25 DIAGNOSIS — Z79899 Other long term (current) drug therapy: Secondary | ICD-10-CM | POA: Diagnosis present

## 2021-10-25 DIAGNOSIS — K922 Gastrointestinal hemorrhage, unspecified: Secondary | ICD-10-CM

## 2021-10-25 DIAGNOSIS — N1832 Chronic kidney disease, stage 3b: Secondary | ICD-10-CM | POA: Insufficient documentation

## 2021-10-25 DIAGNOSIS — I2510 Atherosclerotic heart disease of native coronary artery without angina pectoris: Secondary | ICD-10-CM

## 2021-10-25 DIAGNOSIS — I25119 Atherosclerotic heart disease of native coronary artery with unspecified angina pectoris: Secondary | ICD-10-CM | POA: Diagnosis not present

## 2021-10-25 DIAGNOSIS — I5020 Unspecified systolic (congestive) heart failure: Secondary | ICD-10-CM

## 2021-10-25 LAB — BASIC METABOLIC PANEL
Anion gap: 3 — ABNORMAL LOW (ref 5–15)
BUN: 21 mg/dL (ref 8–23)
CO2: 27 mmol/L (ref 22–32)
Calcium: 9 mg/dL (ref 8.9–10.3)
Chloride: 110 mmol/L (ref 98–111)
Creatinine, Ser: 1.16 mg/dL — ABNORMAL HIGH (ref 0.44–1.00)
GFR, Estimated: 49 mL/min — ABNORMAL LOW (ref >60)
Glucose, Bld: 77 mg/dL (ref 70–99)
Potassium: 4.2 mmol/L (ref 3.5–5.1)
Sodium: 140 mmol/L (ref 135–145)

## 2021-10-25 LAB — LIPID PANEL
Cholesterol: 131 mg/dL (ref 0–200)
HDL: 59 mg/dL (ref >40)
LDL Cholesterol: 61 mg/dL (ref 0–99)
Total CHOL/HDL Ratio: 2.2 RATIO
Triglycerides: 55 mg/dL (ref <150)
VLDL: 11 mg/dL (ref 0–40)

## 2021-10-25 LAB — CBC
HCT: 39.1 % (ref 36.0–46.0)
Hemoglobin: 13.1 g/dL (ref 12.0–15.0)
MCH: 29.1 pg (ref 26.0–34.0)
MCHC: 33.5 g/dL (ref 30.0–36.0)
MCV: 86.9 fL (ref 80.0–100.0)
Platelets: 165 10*3/uL (ref 150–400)
RBC: 4.5 MIL/uL (ref 3.87–5.11)
RDW: 13.9 % (ref 11.5–15.5)
WBC: 6.1 10*3/uL (ref 4.0–10.5)
nRBC: 0 % (ref 0.0–0.2)

## 2021-10-25 NOTE — Patient Instructions (Signed)
Medication Instructions:  Your physician recommends that you continue on your current medications as directed. Please refer to the Current Medication list given to you today.  Follow up with PCP regarding stool guaiac   *If you need a refill on your cardiac medications before your next appointment, please call your pharmacy*   Lab Work: Your physician recommends that you return for lab work in: Today    If you have labs (blood work) drawn today and your tests are completely normal, you will receive your results only by: MyChart Message (if you have MyChart) OR A paper copy in the mail If you have any lab test that is abnormal or we need to change your treatment, we will call you to review the results.   Testing/Procedures: NONE    Follow-Up: At [facility], you and your health needs are our priority.  As part of our continuing mission to provide you with exceptional heart care, we have created designated Provider Care Teams.  These Care Teams include your primary Cardiologist (physician) and Advanced Practice Providers (APPs -  Physician Assistants and Nurse Practitioners) who all work together to provide you with the care you need, when you need it.  We recommend signing up for the patient portal called "MyChart".  Sign up information is provided on this After Visit Summary.  MyChart is used to connect with patients for Virtual Visits (Telemedicine).  Patients are able to view lab/test results, encounter notes, upcoming appointments, etc.  Non-urgent messages can be sent to your provider as well.   To learn more about what you can do with MyChart, go to [email or web address].    Your next appointment:   6 month(s)  The format for your next appointment:   In Person  Provider:   Nona Dell, MD    Other Instructions Thank you for choosing [facility]!    Important Information About Sugar

## 2021-10-25 NOTE — Progress Notes (Signed)
Cardiology Office Note   Date:  10/25/2021   ID:  Nikida, [place]over [birth year], MRN [id number]  PCP:  Amedeo Plenty, DO Cardiologist:  Nona Dell, MD 08/29/2021 Electrphysiologist: None Theodore Demark, PA-C    History of Present Illness: Cassandra Johnson is a 76 y.o. female with a history of DES LAD 07/2020, HTN, HLD, CKD III, GIB 2022  03/30 Office visit, wt 159 lbs, 148/88 BP, Cr 1.20, K+ 4.1 >> Aldactone 12.5 mg qd added, BMET at f/u visit  Cassandra Johnson presents for cardiology follow up  She was hospitalized in [place] shortly after she got the stent, vomiting blood. They stopped the Brilinta and sx resolved. She is tolerating ASA and Plavix.   She has not had a stool guaiac checked her lab work done since that time.  She walks in her apartment complex, no chest pain or SOB with this.   No LE edema, no orthopnea or PND.   No palpitations, no presyncope or syncope.  She has a BP cuff, has not used it, is encouraged to try.  She does not know if her blood pressure today is normal for her, high, or low.  She feels that she is generally doing very well.   COVID status: un-vaccinated Past Medical History:  Diagnosis Date   CAD (coronary artery disease)    DES to LAD March 2022   CKD (chronic kidney disease) stage 3, GFR 30-59 ml/min (HCC)    DM2 (diabetes mellitus, type 2) (HCC)    Essential hypertension    GI bleed    Mixed hyperlipidemia    Secondary cardiomyopathy (HCC)     Past Surgical History:  Procedure Laterality Date   CORONARY STENT INTERVENTION N/A 08/07/2020   Procedure: CORONARY STENT INTERVENTION;  Surgeon: Corky Crafts, MD;  Location: [facility] INVASIVE CV LAB;  Service: Cardiovascular;  Laterality: N/A;   INTRAVASCULAR ULTRASOUND/IVUS N/A 08/07/2020   Procedure: Intravascular Ultrasound/IVUS;  Surgeon: Corky Crafts, MD;  Location: [facility] INVASIVE CV LAB;  Service: Cardiovascular;  Laterality: N/A;   LEFT HEART CATH AND  CORONARY ANGIOGRAPHY N/A 08/07/2020   Procedure: LEFT HEART CATH AND CORONARY ANGIOGRAPHY;  Surgeon: Corky Crafts, MD;  Location: [facility] INVASIVE CV LAB;  Service: Cardiovascular;  Laterality: N/A;   No prior surgeries      Current Outpatient Medications  Medication Sig Dispense Refill   amLODipine (NORVASC) 5 MG tablet Take 1 tablet (5 mg total) by mouth daily. 90 tablet 1   aspirin EC 81 MG tablet Take 1 tablet (81 mg total) by mouth daily. Swallow whole. 90 tablet 3   atorvastatin (LIPITOR) 40 MG tablet TAKE 1 TABLET (40 MG TOTAL) BY MOUTH DAILY AT 6 PM. 30 tablet 3   carvedilol (COREG) 12.5 MG tablet Take 12.5 mg by mouth 2 (two) times daily.     clopidogrel (PLAVIX) 75 MG tablet Take 1 tablet by mouth daily.     glipiZIDE (GLUCOTROL) 5 MG tablet Take 1 tablet by mouth daily.     isosorbide mononitrate (IMDUR) 30 MG 24 hr tablet Take 1 tablet (30 mg total) by mouth daily. 30 tablet 3   latanoprost (XALATAN) 0.005 % ophthalmic solution Place 1 drop into both eyes at bedtime.     sacubitril-valsartan (ENTRESTO) 24-26 MG Take 1 tablet by mouth 2 (two) times daily. 180 tablet 3   spironolactone (ALDACTONE) 25 MG tablet Take 0.5 tablets (12.5 mg total) by mouth daily. 45 tablet 1   No current  facility-administered medications for this visit.    Allergies:   Brilinta [ticagrelor]    Social History:  The patient  reports that she has never smoked. She has never used smokeless tobacco. She reports that she does not currently use alcohol. She reports that she does not currently use drugs.   Family History:  The patient's family history includes Diabetes in her father; Unexplained death in her mother.  She indicated that her mother is deceased. She indicated that her father is deceased.    ROS:  Please see the history of present illness. All other systems are reviewed and negative.    PHYSICAL EXAM: VS:  BP 138/78   Pulse 80   Ht 5' 7.5" (1.715 m)   Wt 165 lb 6.4 oz (75 kg)   SpO2  100%   BMI 25.52 kg/m  , BMI Body mass index is 25.52 kg/m. GEN: Well nourished, well developed, female in no acute distress HEENT: normal for age  Neck: no JVD, no carotid bruit, no masses Cardiac: RRR; no murmur, no rubs, or gallops Respiratory:  clear to auscultation bilaterally, normal work of breathing GI: soft, nontender, nondistended, + BS MS: no deformity or atrophy; no edema; distal pulses are 2+ in all 4 extremities  Skin: warm and dry, no rash Neuro:  Strength and sensation are intact Psych: euthymic mood, full affect   EKG:  EKG is not ordered today.   ECHO: 08/04/2020:  1. Left ventricular ejection fraction, by estimation, is 40 to 45%. The  left ventricle has mildly decreased function. The left ventricle  demonstrates global hypokinesis. There is moderate concentric left  ventricular hypertrophy. Left ventricular  diastolic parameters are indeterminate.   2. Right ventricular systolic function is normal. The right ventricular  size is normal.   3. Left atrial size was mildly dilated.   4. The mitral valve is grossly normal. Trivial mitral valve  regurgitation. Moderate to severe mitral annular calcification.   5. The aortic valve is tricuspid. Aortic valve regurgitation is not  visualized. No aortic stenosis is present.   6. The inferior vena cava is normal in size with greater than 50%  respiratory variability, suggesting right atrial pressure of 3 mmHg.   CATH: 08/07/2020: 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 This was a small vessel. Ost LAD to Prox LAD lesion is 70% stenosed. A drug-eluting stent was successfully placed using a STENT RESOLUTE ONYX 4.0X15 and optimized with intravascular ultrasound. Mid LAD lesion is 25% stenosed. Dist LAD lesion is 10% stenosed. Post intervention, there is a 0% residual stenosis. LV end diastolic pressure is normal. LVEDP 8 mm Hg. There is no aortic valve stenosis. A drug-eluting stent was successfully placed using a STENT RESOLUTE  ONYX 4.0X15.   Due to severe tortuosity at the proximal LAD, it was difficult to cross into the mid LAD.  Super cross catheter would probably be needed in the future if access to the LAD was required.   She also had some tortuosity in the right subclavian.  The right radial pulse was faint although by ultrasound, it appeared the right radial was patent.  The ulnar artery appeared to be larger which is why we chose this approach.  There was some vasospasm in the ulnar which was treated with additional verapamil at the end of the case.    Recent Labs: No results found for requested labs within last 8760 hours.  CBC    Component Value Date/Time   WBC 7.8 08/09/2020 0155   RBC  4.19 08/09/2020 0155   HGB 11.5 (L) 08/09/2020 0155   HCT 34.1 (L) 08/09/2020 0155   PLT 198 08/09/2020 0155   MCV 81.4 08/09/2020 0155   MCH 27.4 08/09/2020 0155   MCHC 33.7 08/09/2020 0155   RDW 14.0 08/09/2020 0155   LYMPHSABS 6.1 (H) 08/03/2020 2101   MONOABS 0.7 08/03/2020 2101   EOSABS 0.3 08/03/2020 2101   BASOSABS 0.1 08/03/2020 2101      Latest Ref Rng & Units 08/08/2020    2:02 AM 08/07/2020   12:55 AM 08/06/2020    8:42 AM  CMP  Glucose 70 - 99 mg/dL 105   117   109    BUN 8 - 23 mg/dL 21   24   26     Creatinine 0.44 - 1.00 mg/dL 1.36   1.53   1.66    Sodium 135 - 145 mmol/L 139   138   138    Potassium 3.5 - 5.1 mmol/L 3.7   4.1   4.2    Chloride 98 - 111 mmol/L 105   104   104    CO2 22 - 32 mmol/L 22   25   25     Calcium 8.9 - 10.3 mg/dL 9.0   8.9   8.6       Lipid Panel Lab Results  Component Value Date   CHOL 266 (H) 08/04/2020   HDL 70 08/04/2020   LDLCALC 156 (H) 08/04/2020   TRIG 198 (H) 08/04/2020   CHOLHDL 3.8 08/04/2020      Wt Readings from Last 3 Encounters:  10/25/21 165 lb 6.4 oz (75 kg)  08/29/21 159 lb 6.4 oz (72.3 kg)  07/17/21 159 lb 3.2 oz (72.2 kg)     Other studies Reviewed: Additional studies/ records that were reviewed today include: Office notes, hospital  records and testing.  ASSESSMENT AND PLAN:  1.  CAD -No ischemic symptoms - Continue ASA, Plavix, statin, beta-blocker, Imdur -No additional testing needed at this time -She is encouraged to continue ambulation and work-up to 30 minutes at a time walking, 2 times a day.  2.  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  GI bleed - She states that after being discharged on aspirin and Brilinta, she began vomiting blood and was hospitalized in [place] - She stated they stopped the Brilinta and she was started on Plavix. - She has not had lab work done since that time - Check a CBC today to follow her anemia. - She is encouraged to follow-up with her family physician to get stool guaiacs done - If she is significantly anemic, will need an iron profile and possibly GI referral  4.  Hyperlipidemia: - She has not had a lipid profile checked in over a year - She states she is compliant with the Lipitor 40 - She has transportation issues and would struggle to come back for a lipid profile, we will go ahead and do it now, nonfasting  Current medicines are reviewed at length with the patient today.  The patient does not have concerns regarding medicines.  The following changes have been made:  no change  Labs/ tests  ordered today include:   Orders Placed This Encounter  Procedures   Basic metabolic panel   CBC   Lipid panel     Disposition:   FU with Rozann Lesches, MD 6 months  Signed, Rosaria Ferries, PA-C  10/25/2021 2:51 PM    [place] Phone: [phone]; Fax: [phone]

## 2022-01-16 ENCOUNTER — Other Ambulatory Visit: Payer: Self-pay | Admitting: Cardiology

## 2022-04-14 ENCOUNTER — Other Ambulatory Visit: Payer: Self-pay | Admitting: Cardiology

## 2022-05-05 ENCOUNTER — Encounter: Payer: Self-pay | Admitting: Cardiology

## 2022-05-05 ENCOUNTER — Telehealth: Payer: Self-pay | Admitting: Cardiology

## 2022-05-05 ENCOUNTER — Ambulatory Visit: Payer: Medicare Other | Attending: Cardiology | Admitting: Cardiology

## 2022-05-05 VITALS — BP 172/104 | HR 78 | Ht 67.0 in | Wt 176.8 lb

## 2022-05-05 DIAGNOSIS — I5022 Chronic systolic (congestive) heart failure: Secondary | ICD-10-CM | POA: Insufficient documentation

## 2022-05-05 DIAGNOSIS — I25119 Atherosclerotic heart disease of native coronary artery with unspecified angina pectoris: Secondary | ICD-10-CM | POA: Diagnosis present

## 2022-05-05 DIAGNOSIS — I10 Essential (primary) hypertension: Secondary | ICD-10-CM | POA: Insufficient documentation

## 2022-05-05 DIAGNOSIS — Z79899 Other long term (current) drug therapy: Secondary | ICD-10-CM | POA: Diagnosis present

## 2022-05-05 DIAGNOSIS — N1832 Chronic kidney disease, stage 3b: Secondary | ICD-10-CM | POA: Insufficient documentation

## 2022-05-05 MED ORDER — SACUBITRIL-VALSARTAN 24-26 MG PO TABS: 1.0000 | ORAL_TABLET | Freq: Two times a day (BID) | ORAL | 0 refills | Status: DC

## 2022-05-05 NOTE — Progress Notes (Signed)
Cardiology Office Note  Date: 05/05/2022   ID: Kingsley, Herandez [birth year], MRN [id number]  PCP:  Amedeo Plenty, DO  Cardiologist:  Nona Dell, MD Electrophysiologist:  None   Chief Complaint  Patient presents with   Cardiac follow-up    History of Present Illness: Cassandra Johnson is a 76 y.o. female last seen in May by Ms. Barrett PA-C, I reviewed the note.  She is here for a follow-up visit.  States that she has had no angina and reports NYHA class I dyspnea.  No fluid retention, no palpitations or syncope.  She tells me that she was approved for [facility] assistance, however never received the medication.  She is not on it today, reports compliance with her other therapy but blood pressure is significantly elevated.  Her last echocardiogram was in March 2022 at which point LVEF was 40 to 45%.  Lab work from earlier this year showed creatinine 1.16 with normal potassium, LDL 61.  Past Medical History:  Diagnosis Date   CAD (coronary artery disease)    DES to LAD March 2022   CKD (chronic kidney disease) stage 3, GFR 30-59 ml/min (HCC)    DM2 (diabetes mellitus, type 2) (HCC)    Essential hypertension    GI bleed    Mixed hyperlipidemia    Secondary cardiomyopathy (HCC)     Past Surgical History:  Procedure Laterality Date   CORONARY STENT INTERVENTION N/A 08/07/2020   Procedure: CORONARY STENT INTERVENTION;  Surgeon: Corky Crafts, MD;  Location: [facility] INVASIVE CV LAB;  Service: Cardiovascular;  Laterality: N/A;   INTRAVASCULAR ULTRASOUND/IVUS N/A 08/07/2020   Procedure: Intravascular Ultrasound/IVUS;  Surgeon: Corky Crafts, MD;  Location: [facility] INVASIVE CV LAB;  Service: Cardiovascular;  Laterality: N/A;   LEFT HEART CATH AND CORONARY ANGIOGRAPHY N/A 08/07/2020   Procedure: LEFT HEART CATH AND CORONARY ANGIOGRAPHY;  Surgeon: Corky Crafts, MD;  Location: [facility] INVASIVE CV LAB;  Service: Cardiovascular;  Laterality: N/A;   No prior surgeries       Current Outpatient Medications  Medication Sig Dispense Refill   amLODipine (NORVASC) 5 MG tablet TAKE ONE TABLET BY MOUTH DAILY 90 tablet 1   aspirin EC 81 MG tablet Take 1 tablet (81 mg total) by mouth daily. Swallow whole. 90 tablet 3   atorvastatin (LIPITOR) 40 MG tablet TAKE 1 TABLET (40 MG TOTAL) BY MOUTH DAILY AT 6 PM. 30 tablet 3   carvedilol (COREG) 12.5 MG tablet Take 12.5 mg by mouth 2 (two) times daily.     glipiZIDE (GLUCOTROL) 5 MG tablet Take 1 tablet by mouth daily.     isosorbide mononitrate (IMDUR) 30 MG 24 hr tablet Take 1 tablet (30 mg total) by mouth daily. 30 tablet 3   latanoprost (XALATAN) 0.005 % ophthalmic solution Place 1 drop into both eyes at bedtime.     spironolactone (ALDACTONE) 25 MG tablet TAKE 1/2 TABLET BY MOUTH DAILY 45 tablet 2   sacubitril-valsartan (ENTRESTO) 24-26 MG Take 1 tablet by mouth 2 (two) times daily. 28 tablet 0   No current facility-administered medications for this visit.   Allergies:  Brilinta [ticagrelor]   ROS: No orthopnea or PND.  No syncope.  Physical Exam: VS:  BP (!) 172/104 (BP Location: Left Arm, Cuff Size: Normal)   Pulse 78   Ht 5\' 7"  (1.702 m)   Wt 176 lb 12.8 oz (80.2 kg)   SpO2 95%   BMI 27.69 kg/m , BMI Body mass index is  27.69 kg/m.  Wt Readings from Last 3 Encounters:  05/05/22 176 lb 12.8 oz (80.2 kg)  10/25/21 165 lb 6.4 oz (75 kg)  08/29/21 159 lb 6.4 oz (72.3 kg)    General: Patient appears comfortable at rest. HEENT: Conjunctiva and lids normal. Neck: Supple, no elevated JVP or carotid bruits. Lungs: Clear to auscultation, nonlabored breathing at rest. Cardiac: Regular rate and rhythm, no S3, 1/6 systolic murmur. Extremities: No pitting edema.  ECG:  An ECG dated 07/17/2021 was personally reviewed today and demonstrated:  Sinus rhythm.  Recent Labwork: 10/25/2021: BUN 21; Creatinine, Ser 1.16; Hemoglobin 13.1; Platelets 165; Potassium 4.2; Sodium 140     Component Value Date/Time   CHOL 131  10/25/2021 1506   TRIG 55 10/25/2021 1506   HDL 59 10/25/2021 1506   CHOLHDL 2.2 10/25/2021 1506   VLDL 11 10/25/2021 1506   LDLCALC 61 10/25/2021 1506    Other Studies Reviewed Today:  Echocardiogram 08/04/2020:  1. Left ventricular ejection fraction, by estimation, is 40 to 45%. The  left ventricle has mildly decreased function. The left ventricle  demonstrates global hypokinesis. There is moderate concentric left  ventricular hypertrophy. Left ventricular  diastolic parameters are indeterminate.   2. Right ventricular systolic function is normal. The right ventricular  size is normal.   3. Left atrial size was mildly dilated.   4. The mitral valve is grossly normal. Trivial mitral valve  regurgitation. Moderate to severe mitral annular calcification.   5. The aortic valve is tricuspid. Aortic valve regurgitation is not  visualized. No aortic stenosis is present.   6. The inferior vena cava is normal in size with greater than 50%  respiratory variability, suggesting right atrial pressure of 3 mmHg.    Cardiac catheterization 08/07/2020: 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 This was a small vessel. Ost LAD to Prox LAD lesion is 70% stenosed. A drug-eluting stent was successfully placed using a STENT RESOLUTE ONYX 4.0X15 and optimized with intravascular ultrasound. Mid LAD lesion is 25% stenosed. Dist LAD lesion is 10% stenosed. Post intervention, there is a 0% residual stenosis. LV end diastolic pressure is normal. LVEDP 8 mm Hg. There is no aortic valve stenosis. A drug-eluting stent was successfully placed using a STENT RESOLUTE ONYX 4.0X15.   Due to severe tortuosity at the proximal LAD, it was difficult to cross into the mid LAD.  Super cross catheter would probably be needed in the future if access to the LAD was required.   She also had some tortuosity in the right subclavian.  The right radial pulse was faint although by ultrasound, it appeared the right radial was patent.  The  ulnar artery appeared to be larger which is why we chose this approach.  There was some vasospasm in the ulnar which was treated with additional verapamil at the end of the case.  Assessment and Plan:  1.  HFmrEF with mixed ischemic/nonischemic cardiomyopathy, LVEF approximately 45% by echocardiogram in March 2022.  She has not been on consistent GDMT as yet, checking again on assistance for [facility] and will provide samples today.  Follow-up BMET thereafter.  She is currently taking Coreg, Aldactone, and Norvasc consistently.  Otherwise no diuretic requirement.  Would likely add SGLT2 inhibitor next although may need to look into coverage for that as well.  Once she is on stable regimen can consider repeating echocardiogram.  2.  Essential hypertension, blood pressure poorly controlled today.  Starting [facility] as discussed above.  Plan to bring her back for further  titration of therapy.  3.  CAD status post DES to the LAD in March 2022.  She does not report any active angina and continues on aspirin and Lipitor.  Stopping Plavix at this point.  4.  CKD stage IIIb, last creatinine 1.16.  Recheck BMET after starting Entresto.  Medication Adjustments/Labs and Tests Ordered: Current medicines are reviewed at length with the patient today.  Concerns regarding medicines are outlined above.   Tests Ordered: Orders Placed This Encounter  Procedures   Basic metabolic panel    Medication Changes: Meds ordered this encounter  Medications   sacubitril-valsartan (ENTRESTO) 24-26 MG    Sig: Take 1 tablet by mouth 2 (two) times daily.    Dispense:  28 tablet    Refill:  0    Lot # [id number] Exp 01/2024    Disposition:  Follow up  4 to 6 weeks.  Signed, Jonelle Sidle, MD, [facility] 05/05/2022 2:19 PM    [facility] [address] [place], [place], [place] [address] Phone: [phone]; Fax: [phone]

## 2022-05-05 NOTE — Patient Instructions (Addendum)
Medication Instructions:  Your physician has recommended you make the following change in your medication:  Restart entresto 24/26 mg by mouth twice daily Stop clopidogrel Continue other medications the same  Labwork: BMET in 2 weeks around 05/19/22. Non-fasting [facility]  Testing/Procedures: none  Follow-Up: Your physician recommends that you schedule a follow-up appointment in: 4-6 weeks  Any Other Special Instructions Will Be Listed Below (If Applicable). Please call [phone] and request a refill on your entresto. This is [employer] Database administrator.   If you need a refill on your cardiac medications before your next appointment, please call your pharmacy.

## 2022-05-05 NOTE — Telephone Encounter (Signed)
Gave number for [employer] [phone])

## 2022-05-05 NOTE — Telephone Encounter (Signed)
Patient called stating she can't find the paper she was given to call about Entresto.

## 2022-06-16 ENCOUNTER — Ambulatory Visit: Payer: Medicare Other | Admitting: Nurse Practitioner

## 2022-06-24 ENCOUNTER — Ambulatory Visit: Payer: Medicare Other | Admitting: Nurse Practitioner

## 2022-06-27 ENCOUNTER — Encounter: Payer: Self-pay | Admitting: *Deleted

## 2022-06-30 ENCOUNTER — Ambulatory Visit: Payer: Medicare Other | Attending: Nurse Practitioner | Admitting: Nurse Practitioner

## 2022-06-30 VITALS — BP 164/90 | HR 68 | Ht 67.0 in | Wt 176.6 lb

## 2022-06-30 DIAGNOSIS — I10 Essential (primary) hypertension: Secondary | ICD-10-CM

## 2022-06-30 DIAGNOSIS — E782 Mixed hyperlipidemia: Secondary | ICD-10-CM | POA: Diagnosis present

## 2022-06-30 DIAGNOSIS — I5022 Chronic systolic (congestive) heart failure: Secondary | ICD-10-CM | POA: Diagnosis present

## 2022-06-30 DIAGNOSIS — N1832 Chronic kidney disease, stage 3b: Secondary | ICD-10-CM | POA: Diagnosis present

## 2022-06-30 DIAGNOSIS — I25119 Atherosclerotic heart disease of native coronary artery with unspecified angina pectoris: Secondary | ICD-10-CM

## 2022-06-30 DIAGNOSIS — I2510 Atherosclerotic heart disease of native coronary artery without angina pectoris: Secondary | ICD-10-CM

## 2022-06-30 DIAGNOSIS — Z79899 Other long term (current) drug therapy: Secondary | ICD-10-CM | POA: Diagnosis present

## 2022-06-30 NOTE — Patient Instructions (Addendum)
Medication Instructions:  Your physician recommends that you continue on your current medications as directed. Please refer to the Current Medication list given to you today.  Labwork: BMET in 1-2 weeks [facility] Non-fasting  Testing/Procedures: none  Follow-Up: Your physician recommends that you schedule a follow-up appointment in: 4-6 weeks  Any Other Special Instructions Will Be Listed Below (If Applicable).  If you need a refill on your cardiac medications before your next appointment, please call your pharmacy.

## 2022-06-30 NOTE — Progress Notes (Unsigned)
Cardiology Office Note:    Date: 06/30/2022  ID:  Cassandra Johnson, DOB [birth year], MRN [id number]  PCP:  Amedeo Plenty, DO   [facility] Providers Cardiologist:  Nona Dell, MD     Referring MD: Amedeo Plenty, DO   CC: Here for 4-6 week follow-up for CHF  History of Present Illness:    Cassandra Johnson is a 77 y.o. female with a hx of the following:   CAD, s/p DES to LAD in March 2022 Mixed hyperlipidemia Type 2 diabetes Secondary cardiomyopathy CKD stage III Hypertension History of GI bleed  Patient is a delightful 78 year old female with past medical history as mentioned above.  Received drug-eluting stent to LAD in March 2022.  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  EF was found to be 45% in March 2022.   Last seen by Dr. Diona Browner on May 05, 2022.  She was doing well from a cardiac perspective.  She was approved for [facility] assistance, however she never received the medication.  She reported compliance with her blood pressure medications, BP was elevated in office that day.  Entresto samples were provided to her with follow-up BMET afterwards.  Dr. Diona Browner suggested adding SGLT2 inhibitor at next follow-up visit, would need to look into coverage for this.  He stated once patient is on stable regimen, could consider repeating echocardiogram.  Plavix was stopped at this visit.  Was told to follow-up in 4 to 6 weeks.  Today she presents for follow-up.  She states she is doing well. Denies any chest pain, shortness of breath, palpitations, syncope, presyncope, dizziness, orthopnea, PND, swelling or significant weight changes, acute bleeding, or claudication.  Tolerating her medications well, and compliant with her medications.  Weight is stable.  Denies any other questions or concerns today.  Past Medical History:  Diagnosis Date   CAD (coronary artery disease)     DES to LAD March 2022   CKD (chronic kidney disease) stage 3, GFR 30-59 ml/min (HCC)    DM2 (diabetes mellitus, type 2) (HCC)    Essential hypertension    GI bleed    Mixed hyperlipidemia    Secondary cardiomyopathy (HCC)     Past Surgical History:  Procedure Laterality Date   CORONARY STENT INTERVENTION N/A 08/07/2020   Procedure: CORONARY STENT INTERVENTION;  Surgeon: Corky Crafts, MD;  Location: [facility] INVASIVE CV LAB;  Service: Cardiovascular;  Laterality: N/A;   INTRAVASCULAR ULTRASOUND/IVUS N/A 08/07/2020   Procedure: Intravascular Ultrasound/IVUS;  Surgeon: Corky Crafts, MD;  Location: [facility] INVASIVE CV LAB;  Service: Cardiovascular;  Laterality: N/A;   LEFT HEART CATH AND CORONARY ANGIOGRAPHY N/A 08/07/2020   Procedure: LEFT HEART CATH AND CORONARY ANGIOGRAPHY;  Surgeon: Corky Crafts, MD;  Location: [facility] INVASIVE CV LAB;  Service: Cardiovascular;  Laterality: N/A;   No prior surgeries      Current Medications: Current Meds  Medication Sig   amLODipine (NORVASC) 5 MG tablet TAKE ONE TABLET BY MOUTH DAILY   aspirin EC 81 MG tablet Take 1 tablet (81 mg total) by mouth daily. Swallow whole.   atorvastatin (LIPITOR) 40 MG tablet TAKE 1 TABLET (40 MG TOTAL) BY MOUTH DAILY AT 6 PM.   carvedilol (COREG) 12.5 MG tablet Take 12.5 mg by mouth 2 (two) times daily.   glipiZIDE (GLUCOTROL) 5 MG tablet Take 1 tablet by mouth daily.   isosorbide mononitrate (IMDUR) 30 MG 24 hr tablet Take 1 tablet (30 mg  total) by mouth daily.   sacubitril-valsartan (ENTRESTO) 24-26 MG Take 1 tablet by mouth 2 (two) times daily.   spironolactone (ALDACTONE) 25 MG tablet TAKE 1/2 TABLET BY MOUTH DAILY     Allergies:   Brilinta [ticagrelor]   Social History   Socioeconomic History   Marital status: Divorced    Spouse name: Not on file   Number of children: Not on file   Years of education: Not on file   Highest education level: Not on file  Occupational History   Not on file  Tobacco  Use   Smoking status: Never   Smokeless tobacco: Never  Vaping Use   Vaping Use: Never used  Substance and Sexual Activity   Alcohol use: Not Currently   Drug use: Not Currently   Sexual activity: Not on file  Other Topics Concern   Not on file  Social History Narrative   Not on file   Social Determinants of Health   Financial Resource Strain: Not on file  Food Insecurity: Not on file  Transportation Needs: Not on file  Physical Activity: Not on file  Stress: Not on file  Social Connections: Not on file     Family History: The patient's family history includes Diabetes in her father; Unexplained death in her mother.  ROS:   Review of Systems  Constitutional: Negative.   HENT: Negative.    Eyes: Negative.   Respiratory: Negative.    Cardiovascular: Negative.   Gastrointestinal: Negative.   Genitourinary: Negative.   Musculoskeletal: Negative.   Skin: Negative.   Neurological: Negative.   Endo/Heme/Allergies: Negative.   Psychiatric/Behavioral: Negative.      Please see the history of present illness.    All other systems reviewed and are negative.  EKGs/Labs/Other Studies Reviewed:    The following studies were reviewed today:   EKG:  EKG is ordered today.  The ekg ordered today demonstrates normal sinus rhythm, 70 bpm, nonspecific ST/T wave changes, no acute ischemic changes.  Left heart cath on August 07, 2020: 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 This was a small vessel. Ost LAD to Prox LAD lesion is 70% stenosed. A drug-eluting stent was successfully placed using a STENT RESOLUTE ONYX 4.0X15 and optimized with intravascular ultrasound. Mid LAD lesion is 25% stenosed. Dist LAD lesion is 10% stenosed. Post intervention, there is a 0% residual stenosis. LV end diastolic pressure is normal. LVEDP 8 mm Hg. There is no aortic valve stenosis. A drug-eluting stent was successfully placed using a STENT RESOLUTE ONYX 4.0X15.   Due to severe tortuosity at the proximal  LAD, it was difficult to cross into the mid LAD.  Super cross catheter would probably be needed in the future if access to the LAD was required.   She also had some tortuosity in the right subclavian.  The right radial pulse was faint although by ultrasound, it appeared the right radial was patent.  The ulnar artery appeared to be larger which is why we chose this approach.  There was some vasospasm in the ulnar which was treated with additional verapamil at the end of the case.    Echocardiogram on August 04, 2020: 1. Left ventricular ejection fraction, by estimation, is 40 to 45%. The  left ventricle has mildly decreased function. The left ventricle  demonstrates global hypokinesis. There is moderate concentric left  ventricular hypertrophy. Left ventricular  diastolic parameters are indeterminate.   2. Right ventricular systolic function is normal. The right ventricular  size is normal.  3. Left atrial size was mildly dilated.   4. The mitral valve is grossly normal. Trivial mitral valve  regurgitation. Moderate to severe mitral annular calcification.   5. The aortic valve is tricuspid. Aortic valve regurgitation is not  visualized. No aortic stenosis is present.   6. The inferior vena cava is normal in size with greater than 50%  respiratory variability, suggesting right atrial pressure of 3 mmHg.   Comparison(s): No prior Echocardiogram.   Conclusion(s)/Recommendation(s): EF 40-45% with global hypokinesis,  moderate LVH. No significant valve disease.  Recent Labs: 10/25/2021: BUN 21; Creatinine, Ser 1.16; Hemoglobin 13.1; Platelets 165; Potassium 4.2; Sodium 140  Recent Lipid Panel    Component Value Date/Time   CHOL 131 10/25/2021 1506   TRIG 55 10/25/2021 1506   HDL 59 10/25/2021 1506   CHOLHDL 2.2 10/25/2021 1506   VLDL 11 10/25/2021 1506   LDLCALC 61 10/25/2021 1506     Risk Assessment/Calculations:    HYPERTENSION CONTROL Vitals:   06/30/22 1103 06/30/22 1114  BP:  (!) 168/100 (!) 164/90    The patient's blood pressure is elevated above target today.  In order to address the patient's elevated BP: Blood pressure will be monitored at home to determine if medication changes need to be made.; Follow up with general cardiology has been recommended.      Physical Exam:    VS:  BP (!) 164/90 (BP Location: Right Arm, Cuff Size: Normal)   Pulse 68   Ht 5\' 7"  (1.702 m)   Wt 176 lb 9.6 oz (80.1 kg)   SpO2 98%   BMI 27.66 kg/m     Wt Readings from Last 3 Encounters:  06/30/22 176 lb 9.6 oz (80.1 kg)  05/05/22 176 lb 12.8 oz (80.2 kg)  10/25/21 165 lb 6.4 oz (75 kg)     GEN: Well nourished, well developed in no acute distress HEENT: Normal NECK: No JVD; No carotid bruits CARDIAC: S1/S2, RRR, no murmurs, rubs, gallops; 2+ pulses RESPIRATORY:  Clear to auscultation without rales, wheezing or rhonchi  MUSCULOSKELETAL:  No edema; No deformity  SKIN: Warm and dry NEUROLOGIC:  Alert and oriented x 3 PSYCHIATRIC:  Normal affect   ASSESSMENT:    1. Coronary artery disease involving native coronary artery of native heart without angina pectoris   2. Heart failure with mildly reduced ejection fraction (HFmrEF) ([place])   3. Medication management   4. Hypertension, unspecified type   5. Mixed hyperlipidemia   6. Stage 3b chronic kidney disease (HCC)    PLAN:    In order of problems listed above:  CAD, s/p DES to LAD in 07/2020 Stable with no anginal symptoms. No indication for ischemic evaluation.  Continue aspirin, atorvastatin, carvedilol, and Imdur. Heart healthy diet and regular cardiovascular exercise encouraged.  ED precautions discussed.  2. HFmrEF, mixed ischemic/nonischemic cardiomyopathy, medication management EF approximately 45% as seen in March 2022 via echocardiogram.  She is compliant with her current medication regimen.  Continue current medication regimen including aspirin, atorvastatin, carvedilol, Imdur, Entresto, and Aldactone.  Will  obtain BMET at this time.  If kidney function permits, increase Entresto to middle dosage, 49/51 mg BID or start Farxiga/Jardiance. Low sodium diet, fluid restriction <2L, and daily weights encouraged. Educated to contact our office for weight gain of 2 lbs overnight or 5 lbs in one week. Heart healthy diet and regular cardiovascular exercise encouraged.   3.  Hypertension BP elevated in office today.  Discussed SBP goal is less than 130.  Continue amlodipine and Aldactone.  Plan to increase Entresto if kidney function permits -see above. Given BP log and discussed to monitor BP at home at least 2 hours after medications and sitting for 5-10 minutes. Heart healthy diet and regular cardiovascular exercise encouraged.   4.  Mixed hyperlipidemia Lipid panel obtained last year was unremarkable.  Continue lovastatin. Heart healthy diet and regular cardiovascular exercise encouraged.   5.  CKD stage 3b Last sCr was 1.38. Will repeat BMET at this time.  Avoid nephrotoxic agents. Continue to follow with PCP.  6. Disposition: Follow-up with me in 4 to 6 weeks or sooner if any changes.   Medication Adjustments/Labs and Tests Ordered: Current medicines are reviewed at length with the patient today.  Concerns regarding medicines are outlined above.  Orders Placed This Encounter  Procedures   Basic metabolic panel   EKG [id number]   No orders of the defined types were placed in this encounter.   Patient Instructions  Medication Instructions:  Your physician recommends that you continue on your current medications as directed. Please refer to the Current Medication list given to you today.  Labwork: BMET in 1-2 weeks [facility] Non-fasting  Testing/Procedures: none  Follow-Up: Your physician recommends that you schedule a follow-up appointment in: 4-6 weeks  Any Other Special Instructions Will Be Listed Below (If Applicable).  If you need a refill on your cardiac medications before your  next appointment, please call your pharmacy.   Signed, Finis Bud, NP  07/01/2022 9:53 AM    [place]

## 2022-07-04 ENCOUNTER — Telehealth: Payer: Self-pay | Admitting: *Deleted

## 2022-07-04 MED ORDER — DAPAGLIFLOZIN PROPANEDIOL 10 MG PO TABS: 10.0000 mg | ORAL_TABLET | Freq: Every day | ORAL | 6 refills | Status: DC

## 2022-07-04 NOTE — Telephone Encounter (Signed)
Patient informed and verbalized understanding of plan. Copy sent to PCP  Farxiga 10 mg 30 day voucher faxed separately BIN# [id number] PCN# [id number] GRP# [id number] ID# [id number]

## 2022-07-04 NOTE — Telephone Encounter (Signed)
-----   Message from Finis Bud, NP sent at 07/03/2022  3:24 PM EST ----- Kidney function stable. Please start Jardiance or Farxiga 10 mg daily, whichever medication will be more affordable. This will be associated with diagnosis of heart failure with mildly reduced ejection fraction. Will see how she is doing on this at next OV.   Thanks!   Finis Bud, AGNP-C

## 2022-07-13 ENCOUNTER — Other Ambulatory Visit: Payer: Self-pay | Admitting: Cardiology

## 2022-07-15 ENCOUNTER — Other Ambulatory Visit: Payer: Self-pay | Admitting: *Deleted

## 2022-07-15 MED ORDER — SACUBITRIL-VALSARTAN 24-26 MG PO TABS: 1.0000 | ORAL_TABLET | Freq: Two times a day (BID) | ORAL | 2 refills | Status: DC

## 2022-07-29 ENCOUNTER — Encounter: Payer: Self-pay | Admitting: Nurse Practitioner

## 2022-07-29 ENCOUNTER — Ambulatory Visit: Payer: Medicare Other | Attending: Nurse Practitioner | Admitting: Nurse Practitioner

## 2022-07-29 VITALS — BP 150/85 | HR 75 | Ht 68.0 in | Wt 177.6 lb

## 2022-07-29 DIAGNOSIS — I2510 Atherosclerotic heart disease of native coronary artery without angina pectoris: Secondary | ICD-10-CM | POA: Diagnosis present

## 2022-07-29 DIAGNOSIS — E782 Mixed hyperlipidemia: Secondary | ICD-10-CM | POA: Diagnosis present

## 2022-07-29 DIAGNOSIS — Z79899 Other long term (current) drug therapy: Secondary | ICD-10-CM

## 2022-07-29 DIAGNOSIS — I10 Essential (primary) hypertension: Secondary | ICD-10-CM | POA: Diagnosis present

## 2022-07-29 DIAGNOSIS — N1832 Chronic kidney disease, stage 3b: Secondary | ICD-10-CM

## 2022-07-29 DIAGNOSIS — I5022 Chronic systolic (congestive) heart failure: Secondary | ICD-10-CM

## 2022-07-29 NOTE — Patient Instructions (Addendum)
Medication Instructions:  Your physician has recommended you make the following change in your medication:  Start farxiga 10 mg once a day Continue all other medications as directed  Labwork: BMET (in 3 weeks at [facility])  Testing/Procedures: none  Follow-Up:  Your physician recommends that you schedule a follow-up appointment in: 6-8 weeks  Any Other Special Instructions Will Be Listed Below (If Applicable).  If you need a refill on your cardiac medications before your next appointment, please call your pharmacy.

## 2022-07-29 NOTE — Progress Notes (Signed)
Cardiology Office Note:    Date: 07/29/2022  ID:  Cassandra Johnson, DOB 1945-09-30, MRN [id number]  PCP:  Cassandra Johnson, [place] Prov[facility] Cardiologist:  Cassandra Lesches, MD     Referring MD: Cassandra Bump, DO   CC: Here for follow-up for CHF  History of Present Illness:    Cassandra Johnson is a 77 y.o. female with a hx of the following:   CAD, s/p DES to LAD in March 2022 Mixed hyperlipidemia Type 2 diabetes Secondary cardiomyopathy CKD stage III Hypertension History of GI bleed  Patient is a delightful 77 year old female with past medical history as mentioned above.  Received drug-eluting stent to LAD in March 2022.  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  EF was found to be 45% in March 2022.   Last seen for follow-up on 06/30/2022. Was doing well. Weight was stable. Verbal orders given to nursing staff to start SGLT2i.   Today she presents for follow-up. States she is doing well. Denies any chest pain, shortness of breath, palpitations, syncope, presyncope, dizziness, orthopnea, PND, swelling or significant weight changes, acute bleeding, or claudication. Wt is stable. Not checking her BP. Has not started SGLT2i.   Past Medical History:  Diagnosis Date   CAD (coronary artery disease)    DES to LAD March 2022   CKD (chronic kidney disease) stage 3, GFR 30-59 ml/min (HCC)    DM2 (diabetes mellitus, type 2) ([place])    Essential hypertension    GI bleed    Mixed hyperlipidemia    Secondary cardiomyopathy ([place])     Past Surgical History:  Procedure Laterality Date   CORONARY STENT INTERVENTION N/A 08/07/2020   Procedure: CORONARY STENT INTERVENTION;  Surgeon: Cassandra Booze, MD;  Location: [place] CV LAB;  Service: Cardiovascular;  Laterality: N/A;   INTRAVASCULAR ULTRASOUND/IVUS N/A 08/07/2020   Procedure: Intravascular Ultrasound/IVUS;  Surgeon:  Cassandra Booze, MD;  Location: [place] CV LAB;  Service: Cardiovascular;  Laterality: N/A;   LEFT HEART CATH AND CORONARY ANGIOGRAPHY N/A 08/07/2020   Procedure: LEFT HEART CATH AND CORONARY ANGIOGRAPHY;  Surgeon: Cassandra Booze, MD;  Location: [place] CV LAB;  Service: Cardiovascular;  Laterality: N/A;   No prior surgeries      Current Medications: Current Meds  Medication Sig   amLODipine (NORVASC) 5 MG tablet TAKE 1 TABLET BY MOUTH DAILY   aspirin EC 81 MG tablet Take 1 tablet (81 mg total) by mouth daily. Swallow whole.   atorvastatin (LIPITOR) 40 MG tablet TAKE 1 TABLET (40 MG TOTAL) BY MOUTH DAILY AT 6 PM.   carvedilol (COREG) 12.5 MG tablet Take 12.5 mg by mouth 2 (two) times daily.   dapagliflozin propanediol (FARXIGA) 10 MG TABS tablet Take 1 tablet (10 mg total) by mouth daily before breakfast. 30 day voucher faxed seperately   glipiZIDE (GLUCOTROL) 5 MG tablet Take 1 tablet by mouth daily.   isosorbide mononitrate (IMDUR) 30 MG 24 hr tablet Take 1 tablet (30 mg total) by mouth daily.   latanoprost (XALATAN) 0.005 % ophthalmic solution Place 1 drop into both eyes at bedtime.   sacubitril-valsartan (ENTRESTO) 24-26 MG Take 1 tablet by mouth 2 (two) times daily.   spironolactone (ALDACTONE) 25 MG tablet TAKE 1/2 TABLET BY MOUTH DAILY     Allergies:   Brilinta [ticagrelor]   Social History   Socioeconomic History   Marital status: Divorced    Spouse  name: Not on file   Number of children: Not on file   Years of education: Not on file   Highest education level: Not on file  Occupational History   Not on file  Tobacco Use   Smoking status: Never   Smokeless tobacco: Never  Vaping Use   Vaping Use: Never used  Substance and Sexual Activity   Alcohol use: Not Currently   Drug use: Not Currently   Sexual activity: Not on file  Other Topics Concern   Not on file  Social History Narrative   Not on file   Social Determinants of Health   Financial  Resource Strain: Not on file  Food Insecurity: Not on file  Transportation Needs: Not on file  Physical Activity: Not on file  Stress: Not on file  Social Connections: Not on file     Family History: The patient's family history includes Diabetes in her father; Unexplained death in her mother.  ROS:   Review of Systems  Constitutional: Negative.   HENT: Negative.    Eyes: Negative.   Respiratory: Negative.    Cardiovascular: Negative.   Gastrointestinal: Negative.   Genitourinary: Negative.   Musculoskeletal: Negative.   Skin: Negative.   Neurological: Negative.   Endo/Heme/Allergies: Negative.   Psychiatric/Behavioral: Negative.      Please see the history of present illness.    All other systems reviewed and are negative.  EKGs/Labs/Other Studies Reviewed:    The following studies were reviewed today:   EKG:  EKG is not ordered today.  Left heart cath on August 07, 2020: 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 This was a small vessel. Ost LAD to Prox LAD lesion is 70% stenosed. A drug-eluting stent was successfully placed using a STENT RESOLUTE ONYX 4.0X15 and optimized with intravascular ultrasound. Mid LAD lesion is 25% stenosed. Dist LAD lesion is 10% stenosed. Post intervention, there is a 0% residual stenosis. LV end diastolic pressure is normal. LVEDP 8 mm Hg. There is no aortic valve stenosis. A drug-eluting stent was successfully placed using a STENT RESOLUTE ONYX 4.0X15.   Due to severe tortuosity at the proximal LAD, it was difficult to cross into the mid LAD.  Super cross catheter would probably be needed in the future if access to the LAD was required.   She also had some tortuosity in the right subclavian.  The right radial pulse was faint although by ultrasound, it appeared the right radial was patent.  The ulnar artery appeared to be larger which is why we chose this approach.  There was some vasospasm in the ulnar which was treated with additional verapamil at  the end of the case.    Echocardiogram on August 04, 2020: 1. Left ventricular ejection fraction, by estimation, is 40 to 45%. The  left ventricle has mildly decreased function. The left ventricle  demonstrates global hypokinesis. There is moderate concentric left  ventricular hypertrophy. Left ventricular  diastolic parameters are indeterminate.   2. Right ventricular systolic function is normal. The right ventricular  size is normal.   3. Left atrial size was mildly dilated.   4. The mitral valve is grossly normal. Trivial mitral valve  regurgitation. Moderate to severe mitral annular calcification.   5. The aortic valve is tricuspid. Aortic valve regurgitation is not  visualized. No aortic stenosis is present.   6. The inferior vena cava is normal in size with greater than 50%  respiratory variability, suggesting right atrial pressure of 3 mmHg.   Comparison(s): No  prior Echocardiogram.   Conclusion(s)/Recommendation(s): EF 40-45% with global hypokinesis,  moderate LVH. No significant valve disease.  Recent Labs: 10/25/2021: BUN 21; Creatinine, Ser 1.16; Hemoglobin 13.1; Platelets 165; Potassium 4.2; Sodium 140  Recent Lipid Panel    Component Value Date/Time   CHOL 131 10/25/2021 1506   TRIG 55 10/25/2021 1506   HDL 59 10/25/2021 1506   CHOLHDL 2.2 10/25/2021 1506   VLDL 11 10/25/2021 1506   LDLCALC 61 10/25/2021 1506     Risk Assessment/Calculations:    HYPERTENSION CONTROL Vitals:   07/29/22 0853 07/29/22 0858  BP: (!) 142/88 (!) 150/85    The patient's blood pressure is elevated above target today.  In order to address the patient's elevated BP: Blood pressure will be monitored at home to determine if medication changes need to be made.; Follow up with general cardiology has been recommended.       Physical Exam:    VS:  BP (!) 150/85 (BP Location: Left Arm, Patient Position: Sitting)   Pulse 75   Ht '5\' 8"'$  (1.727 m)   Wt 177 lb 9.6 oz (80.6 kg)   SpO2 96%    BMI 27.00 kg/m     Wt Readings from Last 3 Encounters:  07/29/22 177 lb 9.6 oz (80.6 kg)  06/30/22 176 lb 9.6 oz (80.1 kg)  05/05/22 176 lb 12.8 oz (80.2 kg)     GEN: Well nourished, well developed in no acute distress HEENT: Normal NECK: No JVD; No carotid bruits CARDIAC: S1/S2, RRR, no murmurs, rubs, gallops; 2+ pulses RESPIRATORY:  Clear to auscultation without rales, wheezing or rhonchi  MUSCULOSKELETAL:  No edema; No deformity  SKIN: Warm and dry NEUROLOGIC:  Alert and oriented x 3 PSYCHIATRIC:  Normal affect   ASSESSMENT:    1. Coronary artery disease involving native coronary artery of native heart without angina pectoris   2. Heart failure with mildly reduced ejection fraction (HFmrEF) ([place])   3. Medication management   4. Essential hypertension, benign   5. Mixed hyperlipidemia   6. Stage 3b chronic kidney disease (HCC)    PLAN:    In order of problems listed above:  CAD, s/p DES to LAD in 07/2020 Stable with no anginal symptoms. No indication for ischemic evaluation.  Continue aspirin, atorvastatin, carvedilol, and Imdur. Heart healthy diet and regular cardiovascular exercise encouraged.  ED precautions discussed.  2. HFmrEF, mixed ischemic/nonischemic cardiomyopathy, medication management EF approximately 45% as seen in March 2022 via echocardiogram.  She is compliant with her current medication regimen.  Continue current medication regimen including aspirin, atorvastatin, carvedilol, Imdur, Entresto, and Aldactone. Will start SGLT2i today, since last kidney function WNL/stable, will re-check BMET in 2-3 weeks. Low sodium diet, fluid restriction <2L, and daily weights encouraged. Educated to contact our office for weight gain of 2 lbs overnight or 5 lbs in one week. Heart healthy diet and regular cardiovascular exercise encouraged.   3.  Hypertension BP elevated in office today.  Discussed SBP goal is less than 130.  Continue amlodipine and Aldactone.  Plan to  increase Aldactone if kidney function permits -see above- will check in 2-3 weeks. Given BP log and discussed to monitor BP at home at least 2 hours after medications and sitting for 5-10 minutes. Heart healthy diet and regular cardiovascular exercise encouraged.   4.  Mixed hyperlipidemia Lipid panel obtained last year was unremarkable.  Continue lovastatin. Heart healthy diet and regular cardiovascular exercise encouraged.   5.  CKD stage 3b Last kidney function  stable Will repeat BMET in 2-3 weeks - see above.  Avoid nephrotoxic agents. Continue to follow with PCP.  6. Disposition: Follow-up with me or APP in 6 to 8 weeks or sooner if any changes.   Medication Adjustments/Labs and Tests Ordered: Current medicines are reviewed at length with the patient today.  Concerns regarding medicines are outlined above.  Orders Placed This Encounter  Procedures   Basic metabolic panel   No orders of the defined types were placed in this encounter.   Patient Instructions  Medication Instructions:  Your physician has recommended you make the following change in your medication:  Start farxiga 10 mg once a day Continue all other medications as directed  Labwork: BMET (in 3 weeks at [facility])  Testing/Procedures: none  Follow-Up:  Your physician recommends that you schedule a follow-up appointment in: 6-8 weeks  Any Other Special Instructions Will Be Listed Below (If Applicable).  If you need a refill on your cardiac medications before your next appointment, please call your pharmacy.    Signed, Finis Bud, NP  07/29/2022 9:27 AM    [place]

## 2022-09-11 ENCOUNTER — Ambulatory Visit: Payer: Medicare Other | Admitting: Nurse Practitioner

## 2022-09-29 ENCOUNTER — Ambulatory Visit: Payer: Medicare Other | Admitting: Nurse Practitioner

## 2022-10-24 ENCOUNTER — Ambulatory Visit: Payer: Medicare Other | Attending: Nurse Practitioner | Admitting: Nurse Practitioner

## 2022-10-24 ENCOUNTER — Other Ambulatory Visit: Payer: Self-pay | Admitting: *Deleted

## 2022-10-24 ENCOUNTER — Encounter: Payer: Self-pay | Admitting: Nurse Practitioner

## 2022-10-24 VITALS — BP 150/90 | HR 70 | Ht 68.0 in | Wt 181.8 lb

## 2022-10-24 DIAGNOSIS — Z79899 Other long term (current) drug therapy: Secondary | ICD-10-CM

## 2022-10-24 DIAGNOSIS — Z91148 Patient's other noncompliance with medication regimen for other reason: Secondary | ICD-10-CM

## 2022-10-24 DIAGNOSIS — E782 Mixed hyperlipidemia: Secondary | ICD-10-CM

## 2022-10-24 DIAGNOSIS — I10 Essential (primary) hypertension: Secondary | ICD-10-CM

## 2022-10-24 DIAGNOSIS — I2510 Atherosclerotic heart disease of native coronary artery without angina pectoris: Secondary | ICD-10-CM

## 2022-10-24 DIAGNOSIS — N1832 Chronic kidney disease, stage 3b: Secondary | ICD-10-CM

## 2022-10-24 DIAGNOSIS — I5022 Chronic systolic (congestive) heart failure: Secondary | ICD-10-CM

## 2022-10-24 NOTE — Progress Notes (Unsigned)
Cardiology Office Note:    Date: 10/24/2022  ID:  [name], DOB May 15, 1946, MRN [id number]  PCP:  Amedeo Plenty, DO   [facility] Providers Cardiologist:  Nona Dell, MD     Referring MD: Amedeo Plenty, DO   CC: Here for follow-up for CHF  History of Present Illness:    Cassandra Johnson is a 77 y.o. female with a hx of the following:   CAD, s/p DES to LAD in March 2022 Mixed hyperlipidemia Type 2 diabetes Secondary cardiomyopathy CKD stage III Hypertension History of GI bleed  Patient is a delightful 77 year old female with past medical history as mentioned above.  Received drug-eluting stent to LAD in March 2022.  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  EF was found to be 45% in March 2022.   Last seen for follow-up on 06/30/2022. Was doing well. Weight was stable. Verbal orders given to nursing staff to start SGLT2i.   Last saw her on 07/29/2022 for follow-up. Was doing well. Denied any chest pain, shortness of breath, palpitations, syncope, presyncope, dizziness, orthopnea, PND, swelling or significant weight changes, acute bleeding, or claudication. Wt is stable. Was not checking her BP. Had not started SGLT2i.   Today she presents for follow-up. She is not taking her SGLT2i or Entresto. Says she ran out of [place] and didn't start Farxiga d/t cost. BP is not at goal, does not check at home. Doing well. Says weight has been stable. Denies any chest pain, shortness of breath, palpitations, syncope, presyncope, dizziness, orthopnea, PND, swelling or significant weight changes, acute bleeding, or claudication.  Past Medical History:  Diagnosis Date   CAD (coronary artery disease)    DES to LAD March 2022   CKD (chronic kidney disease) stage 3, GFR 30-59 ml/min (HCC)    DM2 (diabetes mellitus, type 2) (HCC)    Essential hypertension    GI bleed    Mixed  hyperlipidemia    Secondary cardiomyopathy (HCC)     Past Surgical History:  Procedure Laterality Date   CORONARY STENT INTERVENTION N/A 08/07/2020   Procedure: CORONARY STENT INTERVENTION;  Surgeon: Corky Crafts, MD;  Location: [facility] INVASIVE CV LAB;  Service: Cardiovascular;  Laterality: N/A;   CORONARY ULTRASOUND/IVUS N/A 08/07/2020   Procedure: Intravascular Ultrasound/IVUS;  Surgeon: Corky Crafts, MD;  Location: [facility] INVASIVE CV LAB;  Service: Cardiovascular;  Laterality: N/A;   LEFT HEART CATH AND CORONARY ANGIOGRAPHY N/A 08/07/2020   Procedure: LEFT HEART CATH AND CORONARY ANGIOGRAPHY;  Surgeon: Corky Crafts, MD;  Location: [facility] INVASIVE CV LAB;  Service: Cardiovascular;  Laterality: N/A;   No prior surgeries      Current Medications: Current Meds  Medication Sig   amLODipine (NORVASC) 5 MG tablet TAKE 1 TABLET BY MOUTH DAILY   aspirin EC 81 MG tablet Take 1 tablet (81 mg total) by mouth daily. Swallow whole.   atorvastatin (LIPITOR) 40 MG tablet TAKE 1 TABLET (40 MG TOTAL) BY MOUTH DAILY AT 6 PM.   carvedilol (COREG) 12.5 MG tablet Take 12.5 mg by mouth 2 (two) times daily.   glipiZIDE (GLUCOTROL) 5 MG tablet Take 1 tablet by mouth daily.   isosorbide mononitrate (IMDUR) 30 MG 24 hr tablet Take 1 tablet (30 mg total) by mouth daily.   spironolactone (ALDACTONE) 25 MG tablet TAKE 1/2 TABLET BY MOUTH DAILY     Allergies:   Brilinta [ticagrelor]   Social History   Socioeconomic  History   Marital status: Divorced    Spouse name: Not on file   Number of children: Not on file   Years of education: Not on file   Highest education level: Not on file  Occupational History   Not on file  Tobacco Use   Smoking status: Never   Smokeless tobacco: Never  Vaping Use   Vaping Use: Never used  Substance and Sexual Activity   Alcohol use: Not Currently   Drug use: Not Currently   Sexual activity: Not on file  Other Topics Concern   Not on file  Social History  Narrative   Not on file   Social Determinants of Health   Financial Resource Strain: Not on file  Food Insecurity: Not on file  Transportation Needs: Not on file  Physical Activity: Not on file  Stress: Not on file  Social Connections: Not on file     Family History: The patient's family history includes Diabetes in her father; Unexplained death in her mother.  ROS:   Review of Systems  Constitutional: Negative.   HENT: Negative.    Eyes: Negative.   Respiratory: Negative.    Cardiovascular: Negative.   Gastrointestinal: Negative.   Genitourinary: Negative.   Musculoskeletal: Negative.   Skin: Negative.   Neurological: Negative.   Endo/Heme/Allergies: Negative.   Psychiatric/Behavioral: Negative.      Please see the history of present illness.    All other systems reviewed and are negative.  EKGs/Labs/Other Studies Reviewed:    The following studies were reviewed today:   EKG:  EKG is not ordered today.  Left heart cath on August 07, 2020: 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 This was a small vessel. Ost LAD to Prox LAD lesion is 70% stenosed. A drug-eluting stent was successfully placed using a STENT RESOLUTE ONYX 4.0X15 and optimized with intravascular ultrasound. Mid LAD lesion is 25% stenosed. Dist LAD lesion is 10% stenosed. Post intervention, there is a 0% residual stenosis. LV end diastolic pressure is normal. LVEDP 8 mm Hg. There is no aortic valve stenosis. A drug-eluting stent was successfully placed using a STENT RESOLUTE ONYX 4.0X15.   Due to severe tortuosity at the proximal LAD, it was difficult to cross into the mid LAD.  Super cross catheter would probably be needed in the future if access to the LAD was required.   She also had some tortuosity in the right subclavian.  The right radial pulse was faint although by ultrasound, it appeared the right radial was patent.  The ulnar artery appeared to be larger which is why we chose this approach.  There was some  vasospasm in the ulnar which was treated with additional verapamil at the end of the case.    Echocardiogram on August 04, 2020: 1. Left ventricular ejection fraction, by estimation, is 40 to 45%. The  left ventricle has mildly decreased function. The left ventricle  demonstrates global hypokinesis. There is moderate concentric left  ventricular hypertrophy. Left ventricular  diastolic parameters are indeterminate.   2. Right ventricular systolic function is normal. The right ventricular  size is normal.   3. Left atrial size was mildly dilated.   4. The mitral valve is grossly normal. Trivial mitral valve  regurgitation. Moderate to severe mitral annular calcification.   5. The aortic valve is tricuspid. Aortic valve regurgitation is not  visualized. No aortic stenosis is present.   6. The inferior vena cava is normal in size with greater than 50%  respiratory variability, suggesting  right atrial pressure of 3 mmHg.   Comparison(s): No prior Echocardiogram.   Conclusion(s)/Recommendation(s): EF 40-45% with global hypokinesis,  moderate LVH. No significant valve disease.  Recent Labs: No results found for requested labs within last 365 days.  Recent Lipid Panel    Component Value Date/Time   CHOL 131 10/25/2021 1506   TRIG 55 10/25/2021 1506   HDL 59 10/25/2021 1506   CHOLHDL 2.2 10/25/2021 1506   VLDL 11 10/25/2021 1506   LDLCALC 61 10/25/2021 1506     Risk Assessment/Calculations:    HYPERTENSION CONTROL Vitals:   10/24/22 1124 10/24/22 1132  BP: (!) 160/90 (!) 150/90    The patient's blood pressure is elevated above target today.  In order to address the patient's elevated BP: Blood pressure will be monitored at home to determine if medication changes need to be made.; A new medication was prescribed today.; Follow up with general cardiology has been recommended.    Repeat BP: 163/85     Physical Exam:    VS:  BP (!) 150/90 (BP Location: Right Arm, Cuff Size:  Normal)   Pulse 70   Ht 5\' 8"  (1.727 m)   Wt 181 lb 12.8 oz (82.5 kg)   SpO2 96%   BMI 27.64 kg/m     Wt Readings from Last 3 Encounters:  10/24/22 181 lb 12.8 oz (82.5 kg)  07/29/22 177 lb 9.6 oz (80.6 kg)  06/30/22 176 lb 9.6 oz (80.1 kg)     GEN: Well nourished, well developed in no acute distress HEENT: Normal NECK: No JVD; No carotid bruits CARDIAC: S1/S2, RRR, no murmurs, rubs, gallops; 2+ pulses RESPIRATORY:  Clear to auscultation without rales, wheezing or rhonchi  MUSCULOSKELETAL:  No edema; No deformity  SKIN: Warm and dry NEUROLOGIC:  Alert and oriented x 3 PSYCHIATRIC:  Normal affect   ASSESSMENT:    1. Coronary artery disease involving native coronary artery of native heart without angina pectoris   2. Heart failure with mildly reduced ejection fraction (HFmrEF) (HCC)   3. NICM (nonischemic cardiomyopathy) (HCC)   4. Noncompliance with medications   5. Medication management   6. Essential hypertension, benign   7. Mixed hyperlipidemia   8. Stage 3b chronic kidney disease (HCC)    PLAN:    In order of problems listed above:  CAD, s/p DES to LAD in 07/2020 Stable with no anginal symptoms. No indication for ischemic evaluation.  Continue aspirin, atorvastatin, carvedilol, and Imdur. Heart healthy diet and regular cardiovascular exercise encouraged.  ED precautions discussed.  2. HFmrEF, mixed ischemic/nonischemic cardiomyopathy, medication noncompliance, medication management Stage C, NYHA class I symptoms. Euvolemic and well compensated on exam. EF approximately 45% as seen in March 2022 via echocardiogram. Per review of documentation, found to be noncompliant with medications. Discussed importance of medication compliance. Will provide assistance with medications, see past documentation. Continue current medication regimen including aspirin, atorvastatin, carvedilol, Imdur, Entresto, and Aldactone. Will obtain BMET and if kidney function permits, start SGLT2i and  re-start Entresto. Low sodium diet, fluid restriction <2L, and daily weights encouraged. Educated to contact our office for weight gain of 2 lbs overnight or 5 lbs in one week. Heart healthy diet and regular cardiovascular exercise encouraged. If improvement by next OV, will refer to HF clinic.   3.  Hypertension BP elevated in office today.  Discussed SBP goal is less than 130.  Continue amlodipine and Aldactone. Continue Amlodipine, Coreg, Imdur, and Aldactone. Given BP log and discussed to monitor BP at  home at least 2 hours after medications and sitting for 5-10 minutes. Heart healthy diet and regular cardiovascular exercise encouraged. Notified to call office if SBP is not at goal in next week weeks, would then plan to increase Norvasc.   4.  Mixed hyperlipidemia Lipid panel obtained last year was unremarkable.  Continue lovastatin. Heart healthy diet and regular cardiovascular exercise encouraged.   5.  CKD stage 3b Last kidney function stable Will obtain BMET.  Avoid nephrotoxic agents. Continue to follow with PCP.  6. Disposition: Follow-up with me or APP in 6 to 8 weeks or sooner if any changes.   Medication Adjustments/Labs and Tests Ordered: Current medicines are reviewed at length with the patient today.  Concerns regarding medicines are outlined above.  Orders Placed This Encounter  Procedures   Basic metabolic panel   No orders of the defined types were placed in this encounter.   Patient Instructions  Medication Instructions:  Your physician recommends that you continue on your current medications as directed. Please refer to the Current Medication list given to you today. We will do new applications for your entresto and farxiga. Please complete the patient sections of both applications and bring back to office with proof of income.   Labwork: BMET today at [facility]  Testing/Procedures: none  Follow-Up: Your physician recommends that you schedule a  follow-up appointment in: 6-8 weeks  Any Other Special Instructions Will Be Listed Below (If Applicable). Your physician has requested that you regularly monitor and record your blood pressure readings at home. Please use the same machine at the same time of day to check your readings and record them. Call office in 1-2 weeks with your readings. You top number should be at least 140 or better.  If you need a refill on your cardiac medications before your next appointment, please call your pharmacy.    Signed, [name], NP  10/25/2022 5:25 PM    [facility]

## 2022-10-24 NOTE — Patient Instructions (Addendum)
Medication Instructions:  Your physician recommends that you continue on your current medications as directed. Please refer to the Current Medication list given to you today. We will do new applications for your entresto and farxiga. Please complete the patient sections of both applications and bring back to office with proof of income.   Labwork: BMET today at [facility]  Testing/Procedures: none  Follow-Up: Your physician recommends that you schedule a follow-up appointment in: 6-8 weeks  Any Other Special Instructions Will Be Listed Below (If Applicable). Your physician has requested that you regularly monitor and record your blood pressure readings at home. Please use the same machine at the same time of day to check your readings and record them. Call office in 1-2 weeks with your readings. You top number should be at least 140 or better.  If you need a refill on your cardiac medications before your next appointment, please call your pharmacy.

## 2022-12-09 ENCOUNTER — Encounter: Payer: Self-pay | Admitting: Nurse Practitioner

## 2022-12-09 ENCOUNTER — Ambulatory Visit: Payer: Medicare Other | Attending: Nurse Practitioner | Admitting: Nurse Practitioner

## 2022-12-09 VITALS — BP 130/80 | HR 70 | Ht 68.0 in | Wt 182.2 lb

## 2022-12-09 DIAGNOSIS — E782 Mixed hyperlipidemia: Secondary | ICD-10-CM | POA: Diagnosis present

## 2022-12-09 DIAGNOSIS — I10 Essential (primary) hypertension: Secondary | ICD-10-CM

## 2022-12-09 DIAGNOSIS — I5022 Chronic systolic (congestive) heart failure: Secondary | ICD-10-CM

## 2022-12-09 DIAGNOSIS — Z79899 Other long term (current) drug therapy: Secondary | ICD-10-CM

## 2022-12-09 DIAGNOSIS — Z91148 Patient's other noncompliance with medication regimen for other reason: Secondary | ICD-10-CM | POA: Diagnosis present

## 2022-12-09 DIAGNOSIS — N1832 Chronic kidney disease, stage 3b: Secondary | ICD-10-CM | POA: Diagnosis present

## 2022-12-09 DIAGNOSIS — I2510 Atherosclerotic heart disease of native coronary artery without angina pectoris: Secondary | ICD-10-CM

## 2022-12-09 NOTE — Progress Notes (Signed)
Cardiology Office Note:  .   Date:  12/09/2022  ID:  TYKESHA Cassandra Johnson, DOB 03-23-1946, MRN [id number] PCP: Amedeo Plenty, DO  [facility] Providers Cardiologist:  Nona Dell, MD    History of Present Illness: Cassandra Johnson is a 77 y.o. female with a PMH of CAD, s/p DES to LAD 07/2020, mixed HLD, T2DM, secondary CM, HTN, hx of GI bleed, CKD stage 3, and hx of medication noncompliance, who presents today for CHF follow-up.   I last saw patient on Oct 24, 2022. At the time, she was not taking her SGLT2i or [name] that had been prescribed to her. Stated she ran out of [place] and didn't start Farxiga d/t cost. Was overall doing well from a cardiac perspective.   Today she presents for follow-up. She states she has not restarted Entresto. Could not afford [name] so stopped this medication. Denies any chest pain, shortness of breath, palpitations, syncope, presyncope, dizziness, orthopnea, PND, swelling or significant weight changes, acute bleeding, or claudication.  Studies Reviewed: Marland Kitchen   Left heart cath on August 07, 2020: 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 This was a small vessel. Ost LAD to Prox LAD lesion is 70% stenosed. A drug-eluting stent was successfully placed using a STENT RESOLUTE ONYX 4.0X15 and optimized with intravascular ultrasound. Mid LAD lesion is 25% stenosed. Dist LAD lesion is 10% stenosed. Post intervention, there is a 0% residual stenosis. LV end diastolic pressure is normal. LVEDP 8 mm Hg. There is no aortic valve stenosis. A drug-eluting stent was successfully placed using a STENT RESOLUTE ONYX 4.0X15.   Due to severe tortuosity at the proximal LAD, it was difficult to cross into the mid LAD.  Super cross catheter would probably be needed in the future if access to the LAD was required.   She also had some tortuosity in the right subclavian.  The right radial pulse was faint although by ultrasound, it appeared the right radial was patent.  The ulnar  artery appeared to be larger which is why we chose this approach.  There was some vasospasm in the ulnar which was treated with additional verapamil at the end of the case.     Echocardiogram on August 04, 2020: 1. Left ventricular ejection fraction, by estimation, is 40 to 45%. The  left ventricle has mildly decreased function. The left ventricle  demonstrates global hypokinesis. There is moderate concentric left  ventricular hypertrophy. Left ventricular  diastolic parameters are indeterminate.   2. Right ventricular systolic function is normal. The right ventricular  size is normal.   3. Left atrial size was mildly dilated.   4. The mitral valve is grossly normal. Trivial mitral valve  regurgitation. Moderate to severe mitral annular calcification.   5. The aortic valve is tricuspid. Aortic valve regurgitation is not  visualized. No aortic stenosis is present.   6. The inferior vena cava is normal in size with greater than 50%  respiratory variability, suggesting right atrial pressure of 3 mmHg.   Comparison(s): No prior Echocardiogram.   Conclusion(s)/Recommendation(s): EF 40-45% with global hypokinesis,  moderate LVH. No significant valve disease.   Physical Exam:   VS:  BP 130/80   Pulse 70   Ht 5\' 8"  (1.727 m)   Wt 182 lb 3.2 oz (82.6 kg)   SpO2 98%   BMI 27.70 kg/m    Wt Readings from Last 3 Encounters:  12/09/22 182 lb 3.2 oz (82.6 kg)  10/24/22 181 lb 12.8 oz (82.5  kg)  07/29/22 177 lb 9.6 oz (80.6 kg)    GEN: Well nourished, well developed in no acute distress NECK: No JVD; No carotid bruits CARDIAC: S1/S2, RRR, no murmurs, rubs, gallops RESPIRATORY:  Clear to auscultation without rales, wheezing or rhonchi  ABDOMEN: Soft, non-tender, non-distended EXTREMITIES:  No edema; No deformity   ASSESSMENT AND PLAN: .    1. HFmrEF, mixed ischemic/nonischemic cardiomyopathy, medication noncompliance, medication management Stage C, NYHA class I symptoms. Euvolemic and  well compensated on exam. EF approximately 45% as seen in March 2022 via echocardiogram. Per review of documentation, found to be noncompliant with medications. Discussed importance of medication compliance. Will provide assistance with [name], see past documentation. Continue current medication regimen including aspirin, atorvastatin, carvedilol, Imdur, and Aldactone. Will obtain BMET and if kidney function permits, re-start Entresto at 24/26 mg BID. Low sodium diet, fluid restriction <2L, and daily weights encouraged. Educated to contact our office for weight gain of 2 lbs overnight or 5 lbs in one week. Heart healthy diet and regular cardiovascular exercise encouraged.   2. CAD, s/p DES to LAD in 07/2020 Stable with no anginal symptoms. No indication for ischemic evaluation.  Continue aspirin, atorvastatin, carvedilol (instructed to take twice a day instead of taking once a day as she has been taking), and Imdur. Heart healthy diet and regular cardiovascular exercise encouraged.  ED precautions discussed.   3.  Hypertension BP stable. Continue Amlodipine, Coreg, Imdur, and Aldactone. Given BP log and discussed to monitor BP at home at least 2 hours after medications and sitting for 5-10 minutes. Heart healthy diet and regular cardiovascular exercise encouraged.    4.  Mixed hyperlipidemia Lipid panel obtained last year unremarkable.  Continue lovastatin. Heart healthy diet and regular cardiovascular exercise encouraged. At next visit, address repeating/obtaining FLP/LFT.    5.  CKD stage 3b Last kidney function stable Will obtain BMET as mentioned above.  Avoid nephrotoxic agents. Continue to follow with PCP.   Dispo: Follow-up with me or APP in 3 months or sooner if anything changes.   Signed, Cassandra Dory, NP

## 2022-12-09 NOTE — Patient Instructions (Addendum)
Medication Instructions:  Your physician has recommended you make the following change in your medication:  Start taking carvedilol 12.5 Mg twice daily  Continue all other medications as perscribed  Labwork: BMET 1 week [employer]  Testing/Procedures: none  Follow-Up: Your physician recommends that you schedule a follow-up appointment in: 3 Months with Philis Nettle  Any Other Special Instructions Will Be Listed Below (If Applicable). Entresto Patient assistance given  If you need a refill on your cardiac medications before your next appointment, please call your pharmacy.

## 2022-12-12 ENCOUNTER — Telehealth: Payer: Self-pay | Admitting: Nurse Practitioner

## 2022-12-12 NOTE — Telephone Encounter (Signed)
Spoke to the patient and advised her we reieved a fax regarding her patient assistance forms that she needs to provide more proof of income.

## 2022-12-25 ENCOUNTER — Encounter: Payer: Self-pay | Admitting: Nurse Practitioner

## 2023-01-01 ENCOUNTER — Other Ambulatory Visit: Payer: Self-pay

## 2023-01-01 ENCOUNTER — Other Ambulatory Visit: Payer: Self-pay | Admitting: Nurse Practitioner

## 2023-01-01 DIAGNOSIS — Z79899 Other long term (current) drug therapy: Secondary | ICD-10-CM

## 2023-02-03 ENCOUNTER — Telehealth: Payer: Self-pay | Admitting: Nurse Practitioner

## 2023-02-03 NOTE — Telephone Encounter (Signed)
Spoke with patient, stated that she was at the lab on yesterday to have lab work drawn. Advised her that we were closed. She understood. Due to the lab couldn't see orders, advised her that will print them off and she can pick them up at the office when she is available to go back. Patient stated that she will come by the office tomorrow.

## 2023-02-03 NOTE — Telephone Encounter (Signed)
New Message:     Patient said she went to have her lab work at [facility] ) hospital yesterday. She said they could not do it, because they did not have an order.

## 2023-02-04 ENCOUNTER — Other Ambulatory Visit: Payer: Self-pay | Admitting: Cardiology

## 2023-03-11 ENCOUNTER — Other Ambulatory Visit: Payer: Self-pay | Admitting: Cardiology

## 2023-03-12 ENCOUNTER — Encounter: Payer: Self-pay | Admitting: Nurse Practitioner

## 2023-03-12 ENCOUNTER — Ambulatory Visit: Payer: Medicare Other | Attending: Nurse Practitioner | Admitting: Nurse Practitioner

## 2023-03-12 VITALS — BP 130/90 | HR 73 | Ht 67.0 in | Wt 183.8 lb

## 2023-03-12 DIAGNOSIS — I5022 Chronic systolic (congestive) heart failure: Secondary | ICD-10-CM | POA: Diagnosis present

## 2023-03-12 DIAGNOSIS — E875 Hyperkalemia: Secondary | ICD-10-CM | POA: Diagnosis present

## 2023-03-12 DIAGNOSIS — N1832 Chronic kidney disease, stage 3b: Secondary | ICD-10-CM

## 2023-03-12 DIAGNOSIS — R6889 Other general symptoms and signs: Secondary | ICD-10-CM

## 2023-03-12 DIAGNOSIS — Z91148 Patient's other noncompliance with medication regimen for other reason: Secondary | ICD-10-CM

## 2023-03-12 DIAGNOSIS — I2510 Atherosclerotic heart disease of native coronary artery without angina pectoris: Secondary | ICD-10-CM | POA: Diagnosis present

## 2023-03-12 DIAGNOSIS — I5020 Unspecified systolic (congestive) heart failure: Secondary | ICD-10-CM

## 2023-03-12 DIAGNOSIS — I10 Essential (primary) hypertension: Secondary | ICD-10-CM | POA: Diagnosis present

## 2023-03-12 DIAGNOSIS — E782 Mixed hyperlipidemia: Secondary | ICD-10-CM

## 2023-03-12 MED ORDER — ATORVASTATIN CALCIUM 40 MG PO TABS: 80.0000 mg | ORAL_TABLET | Freq: Every day | ORAL | 3 refills | Status: DC

## 2023-03-12 NOTE — Progress Notes (Addendum)
Cardiology Office Note:  .   Date:  03/12/2023  ID:  Cassandra Johnson, DOB [birth year], MRN [id number] PCP: Cassandra Plenty, DO  [facility] Providers Cardiologist:  Cassandra Dell, MD    History of Present Illness: Cassandra Johnson is a 77 y.o. female with a PMH of CAD, s/p DES to LAD 07/2020, mixed HLD, T2DM, secondary CM, HTN, hx of GI bleed, CKD stage 3, and hx of medication noncompliance, who presents today for CHF follow-up.   I last saw patient on December 09, 2022. At the time, she had not restarted Entersto. Previously could not afford Cassandra Johnson, so stopped taking this medication. Was overall doing well from a cardiac perspective.   Today she presents for follow-up. Patient is a difficult historian. Patient tells CMA she is not taking Coreg or Entresto. When I review medications with her, initially told me she is taking all of her medications including these two and then later on says she is taking Coreg but not taking Entresto. Denied any issues with affordability. Due to concern that there is some confusion with her medications, patient gave me verbal permission to speak with her daughter to confirm. Denies any chest pain, shortness of breath, palpitations, syncope, presyncope, dizziness, orthopnea, PND, swelling or significant weight changes, acute bleeding, or claudication.  Studies Reviewed: Marland Kitchen   Left heart cath on August 07, 2020: 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 This was a small vessel. Ost LAD to Prox LAD lesion is 70% stenosed. A drug-eluting stent was successfully placed using a STENT RESOLUTE ONYX 4.0X15 and optimized with intravascular ultrasound. Mid LAD lesion is 25% stenosed. Dist LAD lesion is 10% stenosed. Post intervention, there is a 0% residual stenosis. LV end diastolic pressure is normal. LVEDP 8 mm Hg. There is no aortic valve stenosis. A drug-eluting stent was successfully placed using a STENT RESOLUTE ONYX 4.0X15.   Due to severe tortuosity at the proximal  LAD, it was difficult to cross into the mid LAD.  Super cross catheter would probably be needed in the future if access to the LAD was required.   She also had some tortuosity in the right subclavian.  The right radial pulse was faint although by ultrasound, it appeared the right radial was patent.  The ulnar artery appeared to be larger which is why we chose this approach.  There was some vasospasm in the ulnar which was treated with additional verapamil at the end of the case.     Echocardiogram on August 04, 2020: 1. Left ventricular ejection fraction, by estimation, is 40 to 45%. The  left ventricle has mildly decreased function. The left ventricle  demonstrates global hypokinesis. There is moderate concentric left  ventricular hypertrophy. Left ventricular  diastolic parameters are indeterminate.   2. Right ventricular systolic function is normal. The right ventricular  size is normal.   3. Left atrial size was mildly dilated.   4. The mitral valve is grossly normal. Trivial mitral valve  regurgitation. Moderate to severe mitral annular calcification.   5. The aortic valve is tricuspid. Aortic valve regurgitation is not  visualized. No aortic stenosis is present.   6. The inferior vena cava is normal in size with greater than 50%  respiratory variability, suggesting right atrial pressure of 3 mmHg.   Comparison(s): No prior Echocardiogram.   Conclusion(s)/Recommendation(s): EF 40-45% with global hypokinesis,  moderate LVH. No significant valve disease.   Physical Exam:   VS:  BP (!) 130/90   Pulse  73   Ht 5\' 7"  (1.702 m)   Wt 183 lb 12.8 oz (83.4 kg)   SpO2 98%   BMI 28.79 kg/m    Wt Readings from Last 3 Encounters:  03/12/23 183 lb 12.8 oz (83.4 kg)  12/09/22 182 lb 3.2 oz (82.6 kg)  10/24/22 181 lb 12.8 oz (82.5 kg)    GEN: Well nourished, well developed in no acute distress NECK: No JVD; No carotid bruits CARDIAC: S1/S2, RRR, no murmurs, rubs, gallops RESPIRATORY:   Clear to auscultation without rales, wheezing or rhonchi  ABDOMEN: Soft, non-tender, non-distended EXTREMITIES:  No edema; No deformity   ASSESSMENT AND PLAN: .    1. HFmrEF, mixed ischemic/nonischemic cardiomyopathy, medication noncompliance Stage C, NYHA class I symptoms. Euvolemic and well compensated on exam. EF approximately 45% as seen in March 2022 via echocardiogram. Per review of documentation, found to be noncompliant with medications and there also appears to be confusion regarding her medications. Will speak with her daughter, pt gave verbal consent. Discussed importance of medication compliance. Will remove Entresto off medication list as she is not taking this. Continue rest of medication regimen including aspirin, atorvastatin, carvedilol, Imdur, and Aldactone. Will obtain BMET to recheck K+ level  - see below. Low sodium diet, fluid restriction <2L, and daily weights encouraged. Educated to contact our office for weight gain of 2 lbs overnight or 5 lbs in one week. Heart healthy diet and regular cardiovascular exercise encouraged.   2. CAD, s/p DES to LAD in 07/2020 Stable with no anginal symptoms. No indication for ischemic evaluation.  Continue aspirin, atorvastatin, carvedilol, and Imdur. Heart healthy diet and regular cardiovascular exercise encouraged.  ED precautions discussed.   3.  Hypertension BP stable. Continue Amlodipine, Coreg, Imdur, and Aldactone. Given BP log and discussed to monitor BP at home at least 2 hours after medications and sitting for 5-10 minutes. Heart healthy diet and regular cardiovascular exercise encouraged.    4.  Mixed hyperlipidemia LDL 02/2023 was 122, will increase atorvastatin to 80 mg daily.  Heart healthy diet and regular cardiovascular exercise encouraged. At next visit, will address repeating/obtaining FLP/LFT.    5.  CKD stage 3b, hyperkalemia Last kidney function stable Will obtain BMET as mentioned above, potassium level most recently was  5.1.  Avoid nephrotoxic agents. Continue to follow with PCP.  6. Forgetfulness, medication noncompliance Patient shows evidence of forgetfulness when it comes to her medication regimen (see HPI), which I believe is causing medication noncompliance. However pt says she does not have difficulty managing/taking her medications at home. Patient gave verbal consent for me to speak with daughter listed on DPR for further verification. Will speak with patient's daughter.  Addendum 03/16/23 - Spoke with patient's daughter (okay per DPR). Said she will be calling # for [employer] and check in with mom and her PCP.  Will accompany her to next office visit.  Dispo: Follow-up with me or APP in 2 months or sooner if anything changes.   Signed, [name], NP

## 2023-03-12 NOTE — Patient Instructions (Addendum)
Medication Instructions:  Your physician has recommended you make the following change in your medication:  Please increase Atorvastatin 80 Mg daily  Continue all other medications as prescribed.   Labwork: In 1 week at [facility]   Testing/Procedures: None  Follow-Up: Your physician recommends that you schedule a follow-up appointment in: 2 Months   Any Other Special Instructions Will Be Listed Below (If Applicable).  If you need a refill on your cardiac medications before your next appointment, please call your pharmacy.

## 2023-05-14 ENCOUNTER — Encounter: Payer: Self-pay | Admitting: Nurse Practitioner

## 2023-05-14 ENCOUNTER — Ambulatory Visit: Payer: Medicare Other | Attending: Nurse Practitioner | Admitting: Nurse Practitioner

## 2023-05-14 VITALS — BP 124/74 | HR 72 | Ht 67.0 in | Wt 184.0 lb

## 2023-05-14 DIAGNOSIS — I5022 Chronic systolic (congestive) heart failure: Secondary | ICD-10-CM | POA: Insufficient documentation

## 2023-05-14 DIAGNOSIS — R6889 Other general symptoms and signs: Secondary | ICD-10-CM

## 2023-05-14 DIAGNOSIS — I2510 Atherosclerotic heart disease of native coronary artery without angina pectoris: Secondary | ICD-10-CM | POA: Diagnosis present

## 2023-05-14 DIAGNOSIS — E782 Mixed hyperlipidemia: Secondary | ICD-10-CM | POA: Diagnosis present

## 2023-05-14 DIAGNOSIS — I10 Essential (primary) hypertension: Secondary | ICD-10-CM | POA: Diagnosis present

## 2023-05-14 DIAGNOSIS — N1832 Chronic kidney disease, stage 3b: Secondary | ICD-10-CM | POA: Diagnosis present

## 2023-05-14 DIAGNOSIS — I5020 Unspecified systolic (congestive) heart failure: Secondary | ICD-10-CM

## 2023-05-14 NOTE — Patient Instructions (Signed)
Medication Instructions:  Your physician recommends that you continue on your current medications as directed. Please refer to the Current Medication list given to you today.  Labwork: In 1 month at [facility]   Testing/Procedures: Your physician has requested that you have an echocardiogram. Echocardiography is a painless test that uses sound waves to create images of your heart. It provides your doctor with information about the size and shape of your heart and how well your heart's chambers and valves are working. This procedure takes approximately one hour. There are no restrictions for this procedure. Please do NOT wear cologne, perfume, aftershave, or lotions (deodorant is allowed). Please arrive 15 minutes prior to your appointment time.  Please note: We ask at that you not bring children with you during ultrasound (echo/ vascular) testing. Due to room size and safety concerns, children are not allowed in the ultrasound rooms during exams. Our front office staff cannot provide observation of children in our lobby area while testing is being conducted. An adult accompanying a patient to their appointment will only be allowed in the ultrasound room at the discretion of the ultrasound technician under special circumstances. We apologize for any inconvenience.  Follow-Up: Your physician recommends that you schedule a follow-up appointment in: 6 months   Any Other Special Instructions Will Be Listed Below (If Applicable).  If you need a refill on your cardiac medications before your next appointment, please call your pharmacy.

## 2023-05-14 NOTE — Progress Notes (Signed)
Cardiology Office Note:  .   Date:  05/14/2023  ID:  Cassandra Johnson, DOB [birth year], MRN [id number] PCP: Amedeo Plenty, DO  [facility] Providers Cardiologist:  Nona Dell, MD    History of Present Illness: Cassandra Johnson is a 77 y.o. female with a PMH of CAD, s/p DES to LAD 07/2020, mixed HLD, T2DM, secondary CM, HTN, hx of GI bleed, CKD stage 3, and hx of medication noncompliance, who presents today for CHF follow-up.   I last saw patient on December 09, 2022. At the time, she had not restarted Entersto. Previously could not afford [name], so stopped taking this medication. Was overall doing well from a cardiac perspective.   03/12/2023 - Today she presents for follow-up. Patient is a difficult historian. Patient tells CMA she is not taking Coreg or Entresto. When I review medications with her, initially told me she is taking all of her medications including these two and then later on says she is taking Coreg but not taking Entresto. Denied any issues with affordability. Due to concern that there is some confusion with her medications, patient gave me verbal permission to speak with her daughter to confirm. Denies any chest pain, shortness of breath, palpitations, syncope, presyncope, dizziness, orthopnea, PND, swelling or significant weight changes, acute bleeding, or claudication.  05/14/2023 -She presents today for follow-up with her daughter.  Patient is doing well and denies any acute cardiac complaints or concerns.  Daughter confirms that she has been having phone tag with patient assistance regarding Entresto. Denies any chest pain, shortness of breath, palpitations, syncope, presyncope, dizziness, orthopnea, PND, swelling or significant weight changes, acute bleeding, or claudication.  Studies Reviewed: Marland Kitchen   Left heart cath on August 07, 2020: 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 This was a small vessel. Ost LAD to Prox LAD lesion is 70% stenosed. A drug-eluting stent was  successfully placed using a STENT RESOLUTE ONYX 4.0X15 and optimized with intravascular ultrasound. Mid LAD lesion is 25% stenosed. Dist LAD lesion is 10% stenosed. Post intervention, there is a 0% residual stenosis. LV end diastolic pressure is normal. LVEDP 8 mm Hg. There is no aortic valve stenosis. A drug-eluting stent was successfully placed using a STENT RESOLUTE ONYX 4.0X15.   Due to severe tortuosity at the proximal LAD, it was difficult to cross into the mid LAD.  Super cross catheter would probably be needed in the future if access to the LAD was required.   She also had some tortuosity in the right subclavian.  The right radial pulse was faint although by ultrasound, it appeared the right radial was patent.  The ulnar artery appeared to be larger which is why we chose this approach.  There was some vasospasm in the ulnar which was treated with additional verapamil at the end of the case.     Echocardiogram on August 04, 2020: 1. Left ventricular ejection fraction, by estimation, is 40 to 45%. The  left ventricle has mildly decreased function. The left ventricle  demonstrates global hypokinesis. There is moderate concentric left  ventricular hypertrophy. Left ventricular  diastolic parameters are indeterminate.   2. Right ventricular systolic function is normal. The right ventricular  size is normal.   3. Left atrial size was mildly dilated.   4. The mitral valve is grossly normal. Trivial mitral valve  regurgitation. Moderate to severe mitral annular calcification.   5. The aortic valve is tricuspid. Aortic valve regurgitation is not  visualized. No aortic stenosis  is present.   6. The inferior vena cava is normal in size with greater than 50%  respiratory variability, suggesting right atrial pressure of 3 mmHg.   Comparison(s): No prior Echocardiogram.   Conclusion(s)/Recommendation(s): EF 40-45% with global hypokinesis,  moderate LVH. No significant valve disease.    Physical Exam:   VS:  BP 124/74   Pulse 72   Ht 5\' 7"  (1.702 m)   Wt 184 lb (83.5 kg)   SpO2 97%   BMI 28.82 kg/m    Wt Readings from Last 3 Encounters:  05/14/23 184 lb (83.5 kg)  03/12/23 183 lb 12.8 oz (83.4 kg)  12/09/22 182 lb 3.2 oz (82.6 kg)    GEN: Well nourished, well developed in no acute distress NECK: No JVD; No carotid bruits CARDIAC: S1/S2, RRR, no murmurs, rubs, gallops RESPIRATORY:  Clear to auscultation without rales, wheezing or rhonchi  ABDOMEN: Soft, non-tender, non-distended EXTREMITIES:  No edema; No deformity   ASSESSMENT AND PLAN: .    1. HFmrEF, mixed ischemic/nonischemic cardiomyopathy Stage C, NYHA class I symptoms. Euvolemic and well compensated on exam. EF approximately 45% as seen in March 2022 via echocardiogram. Continue current medication regimen including aspirin, atorvastatin, carvedilol, Imdur, and Aldactone.  Low sodium diet, fluid restriction <2L, and daily weights encouraged. Educated to contact our office for weight gain of 2 lbs overnight or 5 lbs in one week. Heart healthy diet and regular cardiovascular exercise encouraged.  Will update echocardiogram at this time before adjusting medication regimen further per request of patient and daughter.   2. CAD, s/p DES to LAD in 07/2020 Stable with no anginal symptoms. No indication for ischemic evaluation.  Continue aspirin, atorvastatin, carvedilol, and Imdur. Heart healthy diet and regular cardiovascular exercise encouraged.  ED precautions discussed.   3.  Hypertension BP stable. Continue Amlodipine, Coreg, Imdur, and Aldactone. Given BP log and discussed to monitor BP at home at least 2 hours after medications and sitting for 5-10 minutes. Heart healthy diet and regular cardiovascular exercise encouraged.    4.  Mixed hyperlipidemia LDL 02/2023 was 122, atorvastatin was increased. Heart healthy diet and regular cardiovascular exercise encouraged.  Will obtain FLP/LFT per protocol.    5.  CKD  stage 3b Last kidney function stable.  Avoid nephrotoxic agents. Continue to follow with PCP.  6. Forgetfulness Patient has shown evidence of forgetfulness when it comes to her medication regimen in the past (see HPI). Daughter is providing assistance to patient.    Dispo: Follow-up with Dr. Diona Browner or APP in 6 months or sooner if anything changes.   Signed, [name], NP

## 2023-06-15 ENCOUNTER — Other Ambulatory Visit: Payer: Self-pay | Admitting: Nurse Practitioner

## 2023-06-15 DIAGNOSIS — I5020 Unspecified systolic (congestive) heart failure: Secondary | ICD-10-CM

## 2023-06-15 DIAGNOSIS — E782 Mixed hyperlipidemia: Secondary | ICD-10-CM

## 2023-06-15 DIAGNOSIS — N179 Acute kidney failure, unspecified: Secondary | ICD-10-CM

## 2023-06-15 MED ORDER — ATORVASTATIN CALCIUM 40 MG PO TABS: 40.0000 mg | ORAL_TABLET | Freq: Every day | ORAL | Status: DC

## 2023-06-17 ENCOUNTER — Other Ambulatory Visit: Payer: Medicare Other

## 2023-06-23 ENCOUNTER — Other Ambulatory Visit: Payer: Medicare Other

## 2023-06-30 ENCOUNTER — Other Ambulatory Visit: Payer: Medicare Other

## 2023-08-04 ENCOUNTER — Encounter: Payer: Self-pay | Admitting: Nurse Practitioner

## 2023-09-04 ENCOUNTER — Other Ambulatory Visit: Payer: Self-pay | Admitting: Cardiology

## 2023-09-09 ENCOUNTER — Other Ambulatory Visit: Payer: Medicare Other

## 2023-09-17 ENCOUNTER — Ambulatory Visit: Attending: Nurse Practitioner

## 2023-09-17 DIAGNOSIS — I5022 Chronic systolic (congestive) heart failure: Secondary | ICD-10-CM | POA: Insufficient documentation

## 2023-09-18 LAB — ECHOCARDIOGRAM COMPLETE
AR max vel: 2.81 cm2
AV Area VTI: 3.02 cm2
AV Area mean vel: 3.01 cm2
AV Mean grad: 4 mmHg
AV Peak grad: 6.9 mmHg
Ao pk vel: 1.31 m/s
Area-P 1/2: 4.77 cm2
Calc EF: 62.2 %
MV VTI: 2.67 cm2
S' Lateral: 2.1 cm
Single Plane A2C EF: 62.7 %
Single Plane A4C EF: 59.3 %

## 2023-11-12 ENCOUNTER — Encounter: Payer: Self-pay | Admitting: Nurse Practitioner

## 2023-11-12 ENCOUNTER — Ambulatory Visit: Payer: Medicare Other | Attending: Nurse Practitioner | Admitting: Nurse Practitioner

## 2023-11-12 VITALS — BP 141/82 | HR 68 | Ht 67.0 in | Wt 184.0 lb

## 2023-11-12 DIAGNOSIS — R748 Abnormal levels of other serum enzymes: Secondary | ICD-10-CM | POA: Diagnosis present

## 2023-11-12 DIAGNOSIS — I10 Essential (primary) hypertension: Secondary | ICD-10-CM | POA: Diagnosis present

## 2023-11-12 DIAGNOSIS — N179 Acute kidney failure, unspecified: Secondary | ICD-10-CM

## 2023-11-12 DIAGNOSIS — R6889 Other general symptoms and signs: Secondary | ICD-10-CM | POA: Insufficient documentation

## 2023-11-12 DIAGNOSIS — E782 Mixed hyperlipidemia: Secondary | ICD-10-CM | POA: Insufficient documentation

## 2023-11-12 DIAGNOSIS — I5032 Chronic diastolic (congestive) heart failure: Secondary | ICD-10-CM | POA: Diagnosis present

## 2023-11-12 DIAGNOSIS — I2510 Atherosclerotic heart disease of native coronary artery without angina pectoris: Secondary | ICD-10-CM | POA: Diagnosis present

## 2023-11-12 DIAGNOSIS — N1832 Chronic kidney disease, stage 3b: Secondary | ICD-10-CM | POA: Insufficient documentation

## 2023-11-12 DIAGNOSIS — I5022 Chronic systolic (congestive) heart failure: Secondary | ICD-10-CM

## 2023-11-12 NOTE — Patient Instructions (Addendum)
 Medication Instructions:  Your physician recommends that you continue on your current medications as directed. Please refer to the Current Medication list given to you today.  Labwork: In 1 week at [facility]   Testing/Procedures: None   Follow-Up: Your physician recommends that you schedule a follow-up appointment in: 6 Months  In 2-3 weeks please call us  and update us  on your Blood Pressure Readings   Any Other Special Instructions Will Be Listed Below (If Applicable).  If you need a refill on your cardiac medications before your next appointment, please call your pharmacy.  (Patient Name)-_____________________________  (MRN)-_________________________     (Physician)-_________________________________   (DATE) -_________ (Blood Pressure)-_________  (Heart Rate)-_________    (DATE) -_________ (Blood Pressure)-_________  (Heart Rate)-_________    (DATE) -_________ (Blood Pressure)-_________  (Heart Rate)-_________   (DATE) -_________ (Blood Pressure)-_________  (Heart Rate)-_________    (DATE) -_________ (Blood Pressure)-_________  (Heart Rate)-_________    (DATE) -_________ (Blood Pressure)-_________  (Heart Rate)-_________   (DATE) -_________ (Blood Pressure)-_________  (Heart Rate)-_________    (DATE) -_________ (Blood Pressure)-_________  (Heart Rate)-_________    (DATE) -_________ (Blood Pressure)-_________  (Heart Rate)-_________    (DATE) -_________ (Blood Pressure)-_________  (Heart Rate)-_________    (DATE) -_________ (Blood Pressure)-_________  (Heart Rate)-_________    (DATE) -_________ (Blood Pressure)-_________  (Heart Rate)-_________    (DATE) -_________ (Blood Pressure)-_________  (Heart Rate)-_________    (DATE) -_________ (Blood Pressure)-_________  (Heart Rate)-_________

## 2023-11-12 NOTE — Progress Notes (Addendum)
 Cardiology Office Note:  .   Date:  11/12/2023 ID:  Cassandra Johnson, DOB 1946/04/11, MRN [id number] PCP: Darlis Eisenmenger, DO  [facility] Providers Cardiologist:  Teddie Favre, MD    History of Present Illness: .   Cassandra Johnson is a 78 y.o. female with a PMH of CAD, s/p DES to LAD 07/2020, mixed HLD, T2DM, secondary CM, HTN, hx of GI bleed, CKD stage 3, and hx of medication noncompliance, who presents today for CHF follow-up.   I last saw patient on December 09, 2022. At the time, she had not restarted Entersto. Previously could not afford Farxiga , so stopped taking this medication. Was overall doing well from a cardiac perspective.   03/12/2023 - Today she presents for follow-up. Patient is a difficult historian. Patient tells CMA she is not taking Coreg  or Entresto . When I review medications with her, initially told me she is taking all of her medications including these two and then later on says she is taking Coreg  but not taking Entresto . Denied any issues with affordability. Due to concern that there is some confusion with her medications, patient gave me verbal permission to speak with her daughter to confirm. Denies any chest pain, shortness of breath, palpitations, syncope, presyncope, dizziness, orthopnea, PND, swelling or significant weight changes, acute bleeding, or claudication.  05/14/2023 -She presents today for follow-up with her daughter.  Patient is doing well and denies any acute cardiac complaints or concerns.  Daughter confirms that she has been having phone tag with patient assistance regarding Entresto . Denies any chest pain, shortness of breath, palpitations, syncope, presyncope, dizziness, orthopnea, PND, swelling or significant weight changes, acute bleeding, or claudication.  11/12/2023 -here with her daughter.  Doing well denies any acute cardiac complaints or issues. Does not know what her BP trends are at home. Does admit to some anxiety about today's  office visit. Denies any chest pain, shortness of breath, palpitations, syncope, presyncope, dizziness, orthopnea, PND, swelling or significant weight changes, acute bleeding, or claudication.   Studies Reviewed: Aaron Aas    EKG:  EKG Interpretation Date/Time:  Thursday November 12 2023 10:01:07 EDT Ventricular Rate:  68 PR Interval:  212 QRS Duration:  102 QT Interval:  406 QTC Calculation: 431 R Axis:   37  Text Interpretation: Sinus rhythm with 1st degree A-V block Nonspecific T wave abnormality When compared with ECG of 08-Aug-2020 06:58, No significant change was found Confirmed by Lasalle Pointer [phone]) on 11/12/2023 10:10:35 AM   Echo 09/2023:  1. Left ventricular ejection fraction, by estimation, is 60 to 65%. The  left ventricle has normal function. The left ventricle has no regional  wall motion abnormalities. There is moderate left ventricular hypertrophy.  Left ventricular diastolic  parameters are consistent with Grade I diastolic dysfunction (impaired  relaxation).   2. Right ventricular systolic function is normal. The right ventricular  size is normal. There is normal pulmonary artery systolic pressure.   3. Left atrial size was mildly dilated.   4. The mitral valve is normal in structure. Trivial mitral valve  regurgitation. No evidence of mitral stenosis. Moderate mitral annular  calcification.   5. The tricuspid valve is abnormal.   6. The aortic valve is tricuspid. Aortic valve regurgitation is not  visualized. No aortic stenosis is present.   7. Aortic dilatation noted. There is mild dilatation of the ascending  aorta, measuring 40 mm.   8. The inferior vena cava is dilated in size with >50% respiratory  variability, suggesting  right atrial pressure of 8 mmHg.   Comparison(s): A prior study was performed on 08/04/2020. EF 40-45%.  Moderate LVH. Moderate to severe MAC.  Left heart cath on August 07, 2020: 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 This was a small vessel. Ost LAD to  Prox LAD lesion is 70% stenosed. A drug-eluting stent was successfully placed using a STENT RESOLUTE ONYX 4.0X15 and optimized with intravascular ultrasound. Mid LAD lesion is 25% stenosed. Dist LAD lesion is 10% stenosed. Post intervention, there is a 0% residual stenosis. LV end diastolic pressure is normal. LVEDP 8 mm Hg. There is no aortic valve stenosis. A drug-eluting stent was successfully placed using a STENT RESOLUTE ONYX 4.0X15.   Due to severe tortuosity at the proximal LAD, it was difficult to cross into the mid LAD.  Super cross catheter would probably be needed in the future if access to the LAD was required.   She also had some tortuosity in the right subclavian.  The right radial pulse was faint although by ultrasound, it appeared the right radial was patent.  The ulnar artery appeared to be larger which is why we chose this approach.  There was some vasospasm in the ulnar which was treated with additional verapamil  at the end of the case.     Echocardiogram on August 04, 2020: 1. Left ventricular ejection fraction, by estimation, is 40 to 45%. The  left ventricle has mildly decreased function. The left ventricle  demonstrates global hypokinesis. There is moderate concentric left  ventricular hypertrophy. Left ventricular  diastolic parameters are indeterminate.   2. Right ventricular systolic function is normal. The right ventricular  size is normal.   3. Left atrial size was mildly dilated.   4. The mitral valve is grossly normal. Trivial mitral valve  regurgitation. Moderate to severe mitral annular calcification.   5. The aortic valve is tricuspid. Aortic valve regurgitation is not  visualized. No aortic stenosis is present.   6. The inferior vena cava is normal in size with greater than 50%  respiratory variability, suggesting right atrial pressure of 3 mmHg.   Comparison(s): No prior Echocardiogram.   Conclusion(s)/Recommendation(s): EF 40-45% with global  hypokinesis,  moderate LVH. No significant valve disease.   Physical Exam:   VS:  BP (!) 141/82   Pulse 68   Ht 5' 7 (1.702 m)   Wt 184 lb (83.5 kg)   SpO2 98%   BMI 28.82 kg/m    Wt Readings from Last 3 Encounters:  11/12/23 184 lb (83.5 kg)  05/14/23 184 lb (83.5 kg)  03/12/23 183 lb 12.8 oz (83.4 kg)    GEN: Well nourished, well developed in no acute distress NECK: No JVD; No carotid bruits CARDIAC: S1/S2, RRR, no murmurs, rubs, gallops RESPIRATORY:  Clear to auscultation without rales, wheezing or rhonchi  ABDOMEN: Soft, non-tender, non-distended EXTREMITIES:  No edema; No deformity   ASSESSMENT AND PLAN: .    1. HFimpEF, mixed ischemic/nonischemic cardiomyopathy Stage C, NYHA class I symptoms. Euvolemic and well compensated on exam.  Echocardiogram April 2025 showed improved EF to 60 to 65%.  Continue current medication regimen including aspirin , atorvastatin , carvedilol , Imdur , and Aldactone .  Low sodium diet, fluid restriction <2L, and daily weights encouraged. Educated to contact our office for weight gain of 2 lbs overnight or 5 lbs in one week. Heart healthy diet and regular cardiovascular exercise encouraged.    2. CAD, s/p DES to LAD in 07/2020 Stable with no anginal symptoms. No indication for ischemic evaluation.  Continue aspirin , atorvastatin , carvedilol , and Imdur . Heart healthy diet and regular cardiovascular exercise encouraged.  ED precautions discussed.  Will obtain FLP and CMET.   3.  Hypertension BP elevated today.  Does admit to some anxiety related to office visit.  Continue Amlodipine , Coreg , Imdur , and Aldactone . Given BP log and discussed to monitor BP at home at least 2 hours after medications and sitting for 5-10 minutes. Heart healthy diet and regular cardiovascular exercise encouraged.  She will call our office in the next 2 to 3 weeks and provide update on her BP readings.   4.  Mixed hyperlipidemia, elevated liver enzymes Most recent LDL 60.   However hepatic function panel in January at that time was elevated with AST at 67, ALT 80.  Will repeat FLP/CMET at this time. Heart healthy diet and regular cardiovascular exercise encouraged.  Continue to follow with PCP.  5.  CKD stage 3b Last kidney function stable.  Avoid nephrotoxic agents. Continue to follow with PCP.  She is due for labs and will obtain CMET and CBC.  6. Forgetfulness Patient has shown evidence of forgetfulness when it comes to her medication regimen in the past (see HPI). Daughter is providing assistance to patient, presents with patient for office visit today.   Dispo: Follow-up with Dr. Londa Rival or APP in 6 months or sooner if anything changes.   Signed, Lasalle Pointer, NP

## 2023-11-23 ENCOUNTER — Other Ambulatory Visit (HOSPITAL_COMMUNITY)
Admission: RE | Admit: 2023-11-23 | Discharge: 2023-11-23 | Disposition: A | Discharge status: Home / Self Care | Source: Ambulatory Visit | Attending: Nurse Practitioner | Admitting: Nurse Practitioner

## 2023-11-23 DIAGNOSIS — I5032 Chronic diastolic (congestive) heart failure: Secondary | ICD-10-CM | POA: Diagnosis present

## 2023-11-23 DIAGNOSIS — I2510 Atherosclerotic heart disease of native coronary artery without angina pectoris: Secondary | ICD-10-CM | POA: Diagnosis present

## 2023-11-23 DIAGNOSIS — N1832 Chronic kidney disease, stage 3b: Secondary | ICD-10-CM | POA: Diagnosis present

## 2023-11-23 LAB — COMPREHENSIVE METABOLIC PANEL WITH GFR
ALT: 66 U/L — ABNORMAL HIGH (ref 0–44)
AST: 65 U/L — ABNORMAL HIGH (ref 15–41)
Albumin: 3.7 g/dL (ref 3.5–5.0)
Alkaline Phosphatase: 85 U/L (ref 38–126)
Anion gap: 10 (ref 5–15)
BUN: 15 mg/dL (ref 8–23)
CO2: 26 mmol/L (ref 22–32)
Calcium: 9.1 mg/dL (ref 8.9–10.3)
Chloride: 103 mmol/L (ref 98–111)
Creatinine, Ser: 1.35 mg/dL — ABNORMAL HIGH (ref 0.44–1.00)
GFR, Estimated: 41 mL/min — ABNORMAL LOW (ref >60)
Glucose, Bld: 134 mg/dL — ABNORMAL HIGH (ref 70–99)
Potassium: 4.2 mmol/L (ref 3.5–5.1)
Sodium: 139 mmol/L (ref 135–145)
Total Bilirubin: 0.9 mg/dL (ref 0.0–1.2)
Total Protein: 7.7 g/dL (ref 6.5–8.1)

## 2023-11-23 LAB — LIPID PANEL
Cholesterol: 138 mg/dL (ref 0–200)
HDL: 56 mg/dL (ref >40)
LDL Cholesterol: 69 mg/dL (ref 0–99)
Total CHOL/HDL Ratio: 2.5 ratio
Triglycerides: 64 mg/dL (ref <150)
VLDL: 13 mg/dL (ref 0–40)

## 2023-11-23 LAB — CBC
HCT: 39.9 % (ref 36.0–46.0)
Hemoglobin: 13.5 g/dL (ref 12.0–15.0)
MCH: 29.6 pg (ref 26.0–34.0)
MCHC: 33.8 g/dL (ref 30.0–36.0)
MCV: 87.5 fL (ref 80.0–100.0)
Platelets: 159 10*3/uL (ref 150–400)
RBC: 4.56 MIL/uL (ref 3.87–5.11)
RDW: 13.2 % (ref 11.5–15.5)
WBC: 6.3 10*3/uL (ref 4.0–10.5)
nRBC: 0 % (ref 0.0–0.2)

## 2023-11-26 ENCOUNTER — Ambulatory Visit: Payer: Self-pay | Admitting: Nurse Practitioner

## 2023-12-30 ENCOUNTER — Other Ambulatory Visit: Payer: Self-pay | Admitting: Nurse Practitioner

## 2024-01-08 ENCOUNTER — Other Ambulatory Visit: Payer: Self-pay

## 2024-01-08 MED ORDER — ATORVASTATIN CALCIUM 40 MG PO TABS
40.0000 mg | ORAL_TABLET | Freq: Every day | ORAL | 3 refills | Status: AC
Start: 2024-01-08 — End: ?

## 2024-03-12 ENCOUNTER — Other Ambulatory Visit: Payer: Self-pay | Admitting: Cardiology

## 2024-05-13 ENCOUNTER — Ambulatory Visit: Attending: Nurse Practitioner | Admitting: Nurse Practitioner

## 2024-05-13 ENCOUNTER — Encounter: Payer: Self-pay | Admitting: Nurse Practitioner
